# Patient Record
Sex: Female | Born: 1955 | Race: Black or African American | Hispanic: No | Marital: Single | Smoking: Former smoker
Health system: Southern US, Community
[De-identification: ages and names within clinical notes are randomized; demographics above are authoritative.]

## PROBLEM LIST (undated history)

## (undated) DIAGNOSIS — J45909 Unspecified asthma, uncomplicated: Secondary | ICD-10-CM

## (undated) DIAGNOSIS — E1165 Type 2 diabetes mellitus with hyperglycemia: Secondary | ICD-10-CM

## (undated) DIAGNOSIS — I10 Essential (primary) hypertension: Secondary | ICD-10-CM

## (undated) DIAGNOSIS — F329 Major depressive disorder, single episode, unspecified: Secondary | ICD-10-CM

---

## 1996-08-06 HISTORY — PX: ABDOMINAL HYSTERECTOMY: SHX81

## 2005-09-10 ENCOUNTER — Other Ambulatory Visit: Payer: Self-pay

## 2005-09-10 ENCOUNTER — Emergency Department: Payer: Self-pay | Admitting: Emergency Medicine

## 2005-09-16 ENCOUNTER — Other Ambulatory Visit: Payer: Self-pay

## 2005-09-16 ENCOUNTER — Emergency Department: Payer: Self-pay | Admitting: General Practice

## 2005-09-19 ENCOUNTER — Emergency Department: Payer: Self-pay | Admitting: Emergency Medicine

## 2007-10-29 ENCOUNTER — Ambulatory Visit: Payer: Self-pay | Admitting: Family Medicine

## 2009-09-17 ENCOUNTER — Inpatient Hospital Stay: Payer: Self-pay | Admitting: Internal Medicine

## 2010-06-22 ENCOUNTER — Emergency Department: Payer: Self-pay | Admitting: Emergency Medicine

## 2010-11-11 ENCOUNTER — Emergency Department: Payer: Self-pay | Admitting: Emergency Medicine

## 2011-04-07 ENCOUNTER — Ambulatory Visit: Payer: Self-pay | Admitting: Family Medicine

## 2011-12-25 DIAGNOSIS — E785 Hyperlipidemia, unspecified: Secondary | ICD-10-CM | POA: Insufficient documentation

## 2012-03-12 ENCOUNTER — Observation Stay: Payer: Self-pay | Admitting: Internal Medicine

## 2012-03-12 LAB — CBC
MCH: 29.7 pg (ref 26.0–34.0)
MCHC: 32.8 g/dL (ref 32.0–36.0)
MCV: 91 fL (ref 80–100)
Platelet: 326 10*3/uL (ref 150–440)
RDW: 14.4 % (ref 11.5–14.5)
WBC: 8.9 10*3/uL (ref 3.6–11.0)

## 2012-03-12 LAB — COMPREHENSIVE METABOLIC PANEL
Albumin: 3.1 g/dL — ABNORMAL LOW (ref 3.4–5.0)
Alkaline Phosphatase: 72 U/L (ref 50–136)
Anion Gap: 11 (ref 7–16)
BUN: 12 mg/dL (ref 7–18)
Bilirubin,Total: 0.3 mg/dL (ref 0.2–1.0)
Chloride: 106 mmol/L (ref 98–107)
Creatinine: 1.02 mg/dL (ref 0.60–1.30)
EGFR (African American): >60
EGFR (Non-African Amer.): >60
Osmolality: 284 (ref 275–301)
SGPT (ALT): 15 U/L (ref 12–78)
Sodium: 141 mmol/L (ref 136–145)
Total Protein: 7.3 g/dL (ref 6.4–8.2)

## 2012-03-12 LAB — CK TOTAL AND CKMB (NOT AT ARMC)
CK, Total: 121 U/L (ref 21–215)
CK-MB: 1.1 ng/mL (ref 0.5–3.6)

## 2012-03-13 LAB — LIPID PANEL
Cholesterol: 172 mg/dL (ref 0–200)
HDL Cholesterol: 36 mg/dL — ABNORMAL LOW (ref 40–60)
Triglycerides: 227 mg/dL — ABNORMAL HIGH (ref 0–200)

## 2012-03-13 LAB — HEMOGLOBIN A1C: Hemoglobin A1C: 6.9 % — ABNORMAL HIGH (ref 4.2–6.3)

## 2013-05-16 ENCOUNTER — Emergency Department: Payer: Self-pay | Admitting: Emergency Medicine

## 2013-05-16 LAB — CBC
HCT: 37.5 % (ref 35.0–47.0)
HGB: 12.3 g/dL (ref 12.0–16.0)
MCH: 29.5 pg (ref 26.0–34.0)
MCHC: 32.8 g/dL (ref 32.0–36.0)
RDW: 13.7 % (ref 11.5–14.5)

## 2013-05-16 LAB — URINALYSIS, COMPLETE
Bilirubin,UR: NEGATIVE
Blood: NEGATIVE
Glucose,UR: >=500 mg/dL (ref 0–75)
Ph: 5 (ref 4.5–8.0)
RBC,UR: 2 /HPF (ref 0–5)
Squamous Epithelial: <1
WBC UR: 47 /HPF (ref 0–5)

## 2013-05-16 LAB — COMPREHENSIVE METABOLIC PANEL WITH GFR
Albumin: 3.1 g/dL — ABNORMAL LOW
Alkaline Phosphatase: 68 U/L
Anion Gap: 7
BUN: 8 mg/dL
Bilirubin,Total: 0.4 mg/dL
Calcium, Total: 8.6 mg/dL
Chloride: 102 mmol/L
Co2: 27 mmol/L
Creatinine: 1.05 mg/dL
EGFR (African American): >60
EGFR (Non-African Amer.): 58 — ABNORMAL LOW
Glucose: 365 mg/dL — ABNORMAL HIGH
Osmolality: 285
Potassium: 3.7 mmol/L
SGOT(AST): 21 U/L
SGPT (ALT): 19 U/L
Sodium: 136 mmol/L
Total Protein: 6.9 g/dL

## 2013-05-16 LAB — TROPONIN I: Troponin-I: <0.02 ng/mL

## 2013-05-16 LAB — CK TOTAL AND CKMB (NOT AT ARMC)
CK, Total: 65 U/L
CK-MB: 0.6 ng/mL

## 2013-08-06 DIAGNOSIS — E1165 Type 2 diabetes mellitus with hyperglycemia: Secondary | ICD-10-CM

## 2013-08-06 DIAGNOSIS — IMO0002 Reserved for concepts with insufficient information to code with codable children: Secondary | ICD-10-CM

## 2013-08-06 DIAGNOSIS — F32A Depression, unspecified: Secondary | ICD-10-CM

## 2013-08-06 HISTORY — DX: Reserved for concepts with insufficient information to code with codable children: IMO0002

## 2013-08-06 HISTORY — DX: Depression, unspecified: F32.A

## 2013-08-06 HISTORY — DX: Type 2 diabetes mellitus with hyperglycemia: E11.65

## 2013-09-18 ENCOUNTER — Ambulatory Visit: Payer: Self-pay | Admitting: Family Medicine

## 2013-09-18 LAB — GC/CHLAMYDIA PROBE AMP

## 2013-09-18 LAB — WET PREP, GENITAL

## 2013-10-08 DIAGNOSIS — I10 Essential (primary) hypertension: Secondary | ICD-10-CM | POA: Insufficient documentation

## 2013-11-23 DIAGNOSIS — F32A Depression, unspecified: Secondary | ICD-10-CM | POA: Insufficient documentation

## 2013-12-21 ENCOUNTER — Ambulatory Visit: Payer: Self-pay

## 2013-12-21 LAB — RAPID STREP-A WITH REFLX: Micro Text Report: POSITIVE

## 2014-01-08 DIAGNOSIS — J4520 Mild intermittent asthma, uncomplicated: Secondary | ICD-10-CM | POA: Insufficient documentation

## 2014-05-17 ENCOUNTER — Ambulatory Visit: Payer: Self-pay

## 2014-11-23 NOTE — Discharge Summary (Signed)
PATIENT NAME:  Michelle [name], Michelle [name] MR#:  [id number] DATE OF BIRTH:  Sep 20, 1955  DATE OF ADMISSION:  03/12/2012 DATE OF DISCHARGE:  03/15/2012  PRESENTING COMPLAINT: Chest pain.   DISCHARGE DIAGNOSES:  1. Chest pain, suspected noncardiac.  2. Cardiomyopathy with ejection fraction of around 45% per Myoview stress test.  3. Hypertension.  4. Type 2 diabetes.  5. Hyperlipidemia.  6. Medical noncompliance.   CONDITION ON DISCHARGE: Fair.   CODE STATUS: FULL CODE.   DISCHARGE MEDICATIONS:  1. Metoprolol 25 mg [name].o. b.i.d.  2. Lisinopril 5 mg [name].o. daily.  3. Aspirin 81 mg daily.  4. Pravastatin 20 mg daily.  5. Metformin 500 mg [name].o. daily.  6. Albuterol oral inhaler 2 puffs q.i.d. [name].r.n.   DIET: Low sodium, ADA 1800-calorie diet.   FOLLOWUP: [facility] and follow up there.   PROCEDURES: Cardiac stress test showed ejection fraction of around 45%. No acute ischemia noted. Echo Doppler showed grossly normal size. No thrombus. Left ventricular systolic function is normal, ejection fraction around 55%. Cholesterol 172, triglycerides 227, HDL 36, hemoglobin A1c 6.9. Cardiac enzymes, troponin times three negative. Chest x-ray: No acute cardiopulmonary abnormality. EKG showed normal sinus rhythm with minimal voltage criteria for left ventricular hypertrophy. CBC within normal limits. Comprehensive metabolic panel within normal limits except glucose of 146.   BRIEF SUMMARY OF HOSPITAL COURSE: [name] is a 59 year old obese African American female with history of hypertension, asthma, and borderline diabetes who has been off medicines for the past several months secondary to noncompliance, continues with history of smoking, and came to the Emergency Room with:  1. Chest pain: The patient was admitted on the telemetry floor.  She has risk factors of diabetes and hypertension, which was untreated. She was admitted and was ruled out for acute coronary  syndrome by three sets of negative cardiac enzymes negative, no new acute EKG changes, and negative Myoview stress test. The patient's ejection fraction per Myoview by Dr. Darrold JunkerParaschos showed ejection fraction around 45%; however, echo showed ejection fraction over 55%.  It was stressed to the patient to continue her blood pressure medicines in order to avoid any problems with her heart. She was continued on aspirin, beta blockers, ACE inhibitors, and statins. The [facility].  2. Accelerated hypertension: ACE inhibitors did stabilize the patient's blood pressure.  3. Asthma: Stable. No wheezing. Sats normal. We will continue low dose beta blockers for now. Type 2 diabetes: Her metformin was resumed.  4. Tobacco abuse: The patient was counseled on smoking cessation.  5. Hospital stay otherwise remained stable.   TIME SPENT: 40 minutes.    ____________________________ Wylie HailSona A. Allena KatzPatel, MD sap:bjt D: 03/16/2012 07:03:32 ET T: 03/17/2012 11:20:24 ET JOB#: [id number]  cc: Waylon Hershey A. Allena KatzPatel, MD, <Dictator> [facility] Willow OraSONA A Karrie Fluellen MD ELECTRONICALLY SIGNED 03/17/2012 16:06

## 2014-11-23 NOTE — H&P (Signed)
PATIENT NAME:  Michelle [name], Michelle [name] MR#:  [id number] DATE OF BIRTH:  March 31, 1956  DATE OF ADMISSION:  03/12/2012  PRIMARY CARE PHYSICIAN: None local.  REFERRING PHYSICIAN: Olivia MackieGina Martin, MD  HISTORY OF PRESENT ILLNESS: The patient is a 59 year old African American female with a history of hypertension, borderline diabetes, and asthma who presented to the emergency department with chest pain on the left side for two days which is radiating to the left arm and neck, intermittent, 10 out of 10 on maximum. The patient also complains of a headache, dizziness, and palpitations. She was treated with aspirin 325 mg at home and was started on nitroglycerin in the ED. The patient still has chest pain, but she feels better. The patient has hypertension and borderline diabetes. She was on metformin and HCTZ. She has been off these medications since June because she could not afford it.   PAST MEDICAL HISTORY:  1. Hypertension. 2. Diabetes. 3. Asthma.   SOCIAL HISTORY: Has smoked half pack a day for 80 years. No alcohol drinking. No illicit drugs.   PAST SURGICAL HISTORY: Hysterectomy.   FAMILY HISTORY: Mother had a heart attack, stroke, diabetes, and hypertension. Two brothers had heart attacks in their 240s.   MEDICATIONS: None.   ALLERGIES: Penicillin.  REVIEW OF SYSTEMS: CONSTITUTIONAL: The patient denies any fever or chills, but has a headache and dizziness. EYES: No double vision or double vision. ENT: No hearing loss, epistaxis, postnasal drip, or slurred speech. RESPIRATORY: No cough, sputum, shortness of breath, or hematemesis. CARDIOVASCULAR: Positive for chest pain and palpitations. No leg edema, orthopnea, or nocturnal dyspnea. GASTROINTESTINAL: No abdominal pain, nausea, vomiting, or diarrhea. No melena or bloody stools. RESPIRATORY: No cough, sputum, shortness of breath, hemoptysis, or wheezing. GENITOURINARY: No dysuria, hematuria, or incontinence. ENDOCRINE: No polyuria, polydipsia, or heat or  cold intolerance. HEMATOLOGY: No easy bruising or bleeding. NEUROLOGY: No syncope, loss of consciousness, or seizure. SKIN: No rash or jaundice.   PHYSICAL EXAMINATION:   VITALS: Temperature 97.1, blood pressure 165/80, pulse 64, and oxygen saturation 100% on room air.   GENERAL: The patient is alert, awake, and oriented, in no acute distress.   HEENT: Pupils are round, equal, and reactive to light and accommodation. Moist oral mucosa. Clear oropharynx.   NECK: Supple. No JVD or carotid bruit. No lymphadenopathy. No thyromegaly.   CARDIOVASCULAR: S1 and S2 regular rate and rhythm. No murmurs or gallops.   PULMONARY: Bilateral air entry. No wheezing or rales. No use of accessory muscles to breathe.   CHEST WALL: Tenderness on the left side and tenderness on the left shoulder.   ABDOMEN: Soft, obese. No distention or tenderness. No organomegaly. Bowel sounds present.   EXTREMITIES: No edema, clubbing, or cyanosis. No calf tenderness. Strong bilateral pedal pulses.   SKIN: No rash or jaundice.   NEUROLOGY: Alert and oriented x3. No focal deficits. Power 5 out of 5. Sensation intact. Deep tendon reflexes 2+.   LABORATORY, DIAGNOSTIC AND RADIOLOGIC DATA: Troponin less than 0.02. CK 121 and CK-MB 1.1. CBC is normal. Glucose is 146, BUN 12, and creatinine 1.02. Electrolytes are normal.   Chest x-ray: No evidence of acute cardiopulmonary disease.  EKG shows sinus rhythm at 72 beats per minute.   IMPRESSION:  1. Chest pain, possible musculoskeletal etiology due to chest wall tenderness, but need to rule out ACS due to family history of myocardial infarction.  2. Accelerated hypertension.  3. Borderline diabetes.  4. Obesity.  5. History of asthma.  6. Tobacco use.  PLAN OF TREATMENT:  1. The patient will be admitted to the telemetry floor. We will continue aspirin, nitroglycerin [name].r.n., and stop nitro drip and change to Lopressor and lisinopril. We will start Zocor and check troponin  level and lipid panel and the patient needs a stress test as an outpatient. 2. For borderline diabetes, we will start sliding scale and check a Hemoglobin A1c. 3. For tobacco use, the patient was counseled on smoking cessation. 4. GI and DVT prophylaxis.   I discussed the patient's situation and the plan of treatment with the patient.   TIME SPENT: About 50 minutes.  ____________________________ Shaune Pollack, MD qc:slb D: 03/12/2012 16:35:24 ET     T: 03/12/2012 17:02:54 ET        JOB#: [id number] cc: Shaune Pollack, MD, <Dictator> Shaune Pollack MD ELECTRONICALLY SIGNED 03/12/2012 18:32

## 2014-12-08 ENCOUNTER — Ambulatory Visit
Admission: EM | Admit: 2014-12-08 | Discharge: 2014-12-08 | Disposition: A | Discharge status: Home / Self Care | Payer: Medicaid Other | Attending: Emergency Medicine | Admitting: Emergency Medicine

## 2014-12-08 ENCOUNTER — Ambulatory Visit: Payer: Medicaid Other

## 2014-12-08 ENCOUNTER — Encounter: Payer: Self-pay | Admitting: Family Medicine

## 2014-12-08 DIAGNOSIS — I10 Essential (primary) hypertension: Secondary | ICD-10-CM | POA: Insufficient documentation

## 2014-12-08 DIAGNOSIS — E1165 Type 2 diabetes mellitus with hyperglycemia: Secondary | ICD-10-CM | POA: Diagnosis not present

## 2014-12-08 DIAGNOSIS — J029 Acute pharyngitis, unspecified: Secondary | ICD-10-CM | POA: Diagnosis present

## 2014-12-08 DIAGNOSIS — J45909 Unspecified asthma, uncomplicated: Secondary | ICD-10-CM | POA: Diagnosis not present

## 2014-12-08 DIAGNOSIS — J4520 Mild intermittent asthma, uncomplicated: Secondary | ICD-10-CM

## 2014-12-08 DIAGNOSIS — Z87891 Personal history of nicotine dependence: Secondary | ICD-10-CM | POA: Diagnosis not present

## 2014-12-08 DIAGNOSIS — F329 Major depressive disorder, single episode, unspecified: Secondary | ICD-10-CM | POA: Insufficient documentation

## 2014-12-08 DIAGNOSIS — R05 Cough: Secondary | ICD-10-CM | POA: Insufficient documentation

## 2014-12-08 DIAGNOSIS — J209 Acute bronchitis, unspecified: Secondary | ICD-10-CM | POA: Diagnosis not present

## 2014-12-08 DIAGNOSIS — N898 Other specified noninflammatory disorders of vagina: Secondary | ICD-10-CM

## 2014-12-08 DIAGNOSIS — R059 Cough, unspecified: Secondary | ICD-10-CM

## 2014-12-08 DIAGNOSIS — R509 Fever, unspecified: Secondary | ICD-10-CM | POA: Diagnosis present

## 2014-12-08 HISTORY — DX: Essential (primary) hypertension: I10

## 2014-12-08 HISTORY — DX: Major depressive disorder, single episode, unspecified: F32.9

## 2014-12-08 HISTORY — DX: Type 2 diabetes mellitus with hyperglycemia: E11.65

## 2014-12-08 LAB — URINALYSIS COMPLETE WITH MICROSCOPIC (ARMC ONLY)
Bacteria, UA: NONE SEEN — AB
Bilirubin Urine: NEGATIVE
Glucose, UA: 500 mg/dL — AB
Hgb urine dipstick: NEGATIVE
Ketones, ur: NEGATIVE mg/dL
Leukocytes, UA: NEGATIVE
Nitrite: NEGATIVE
Protein, ur: NEGATIVE mg/dL
RBC / HPF: NONE SEEN RBC/hpf (ref <3)
Specific Gravity, Urine: 1.005 (ref 1.005–1.030)
pH: 5 (ref 5.0–8.0)

## 2014-12-08 MED ORDER — IPRATROPIUM-ALBUTEROL 0.5-2.5 (3) MG/3ML IN SOLN
3.0000 mL | RESPIRATORY_TRACT | Status: DC
  Administered 2014-12-08: 3 mL via RESPIRATORY_TRACT

## 2014-12-08 MED ORDER — ALBUTEROL SULFATE HFA 108 (90 BASE) MCG/ACT IN AERS: 2.0000 | INHALATION_SPRAY | RESPIRATORY_TRACT | Status: AC | PRN

## 2014-12-08 MED ORDER — AZITHROMYCIN 250 MG PO TABS: 250.0000 mg | ORAL_TABLET | Freq: Every day | ORAL | Status: DC

## 2014-12-08 MED ORDER — BENZONATATE 100 MG PO CAPS: 100.0000 mg | ORAL_CAPSULE | Freq: Three times a day (TID) | ORAL | Status: DC

## 2014-12-08 NOTE — ED Provider Notes (Addendum)
CSN: [id number]     Arrival date & time 12/08/14  1305 History   None    Chief Complaint  Patient presents with  . Fever    yesterday  . Sore Throat    3 days  . Vaginal Itching    2 days   (Consider location/radiation/quality/duration/timing/severity/associated sxs/prior Treatment) HPI Comments: Pt states she had asthma flare of weekend w coughing and made sore throat. Also staets tried new detergent and toilet paper and having vaginal itching without discharge since then. Pt has since switched back to original detergent/toilet paper. Pt denies recent new sexual partner/activity and denies any concern of STI exposure  Patient is a 59 y.o. female presenting with URI. The history is provided by the patient. No language interpreter was used.  URI Presenting symptoms: congestion, cough, fever and sore throat   Presenting symptoms: no ear pain and no rhinorrhea   Severity:  Moderate Onset quality:  Sudden Timing:  Constant Progression:  Unchanged Chronicity:  New Relieved by:  Nothing Ineffective treatments:  OTC medications, rest and inhaler Associated symptoms: sneezing   Associated symptoms: no headaches and no myalgias   Risk factors: diabetes mellitus and sick contacts     Past Medical History  Diagnosis Date  . Diabetes type 2, uncontrolled 2015  . Hypertension   . Depression 2015   Past Surgical History  Procedure Laterality Date  . Abdominal hysterectomy  1998   Family History  Problem Relation Age of Onset  . Hypertension Mother   . Heart disease Mother   . Kidney disease Father   . Heart disease Father   . Hypertension Father    History  Substance Use Topics  . Smoking status: Former Smoker    Quit date: 08/19/2013  . Smokeless tobacco: Not on file  . Alcohol Use: No   OB History    Gravida Para Term Preterm AB TAB SAB Ectopic Multiple Living   5 4             Review of Systems  Constitutional: Positive for fever. Negative for chills.  HENT:  Positive for congestion, sneezing and sore throat. Negative for ear pain and rhinorrhea.   Eyes: Negative.   Respiratory: Positive for cough.   Gastrointestinal: Negative for nausea and vomiting.  Endocrine: Negative.   Genitourinary: Negative for dysuria.  Musculoskeletal: Negative for myalgias.  Skin: Negative for rash.  Allergic/Immunologic: Negative.   Neurological: Negative for headaches.  Hematological: Negative.   Psychiatric/Behavioral: Negative.   All other systems reviewed and are negative.   Allergies  Morphine and related and Penicillins  Home Medications   Prior to Admission medications   Medication Sig Start Date End Date Taking? Authorizing Provider  citalopram (CELEXA) 10 MG tablet Take 10 mg by mouth daily.   Yes Historical Provider, MD  insulin glargine (LANTUS) 100 UNIT/ML injection Inject 75 Units into the skin at bedtime.   Yes Historical Provider, MD  lisinopril (PRINIVIL,ZESTRIL) 10 MG tablet Take 10 mg by mouth daily.   Yes Historical Provider, MD  metFORMIN (GLUCOPHAGE) 1000 MG tablet Take 1,000 mg by mouth daily with breakfast.   Yes Historical Provider, MD  albuterol (PROVENTIL HFA;VENTOLIN HFA) 108 (90 BASE) MCG/ACT inhaler Inhale 2 puffs into the lungs every 4 (four) hours as needed for wheezing or shortness of breath. 12/08/14   Clancy Gourd, NP  azithromycin (ZITHROMAX) 250 MG tablet Take 1 tablet (250 mg total) by mouth daily. Take first 2 tablets together, then 1 every day until  finished. 12/08/14   Clancy GourdJeanette Lotus Gover, NP  benzonatate (TESSALON) 100 MG capsule Take 1 capsule (100 mg total) by mouth every 8 (eight) hours. 12/08/14   Ilean Spradlin, NP   BP 153/73 mmHg  Pulse 92  Temp(Src) 98.2 F (36.8 C) (Oral)  Resp 18  Ht 5\' 4"  (1.626 m)  Wt 248 lb (112.492 kg)  BMI 42.55 kg/m2  SpO2 99% Physical Exam  Constitutional: She is oriented to person, place, and time. She appears well-developed and well-nourished. She is active and cooperative. No  distress.  HENT:  Head: Normocephalic.  Right Ear: Tympanic membrane normal.  Left Ear: Tympanic membrane normal.  Nose: Mucosal edema present.  Mouth/Throat: Uvula is midline, oropharynx is clear and moist and mucous membranes are normal.  Eyes: Conjunctivae, EOM and lids are normal. Pupils are equal, round, and reactive to light.  Neck: Trachea normal and normal range of motion. No tracheal deviation present.  Cardiovascular: Regular rhythm, normal heart sounds and normal pulses.   No murmur heard. Pulmonary/Chest: Effort normal. She has wheezes in the right lower field and the left lower field.  Mild wheezing noted bialterally  Abdominal: Soft. Bowel sounds are normal. There is no tenderness.  Musculoskeletal: Normal range of motion.  Lymphadenopathy:    She has no cervical adenopathy.  Neurological: She is alert and oriented to person, place, and time. GCS eye subscore is 4. GCS verbal subscore is 5. GCS motor subscore is 6.  Skin: Skin is warm and dry. No rash noted.  Psychiatric: She has a normal mood and affect. Her speech is normal and behavior is normal.  Nursing note and vitals reviewed.   ED Course  Procedures (including critical care time) Labs Review Labs Reviewed  URINALYSIS COMPLETEWITH MICROSCOPIC [place]([facility])  - Abnormal; Notable for the following:    Color, Urine STRAW (*)    Glucose, UA 500 (*)    Bacteria, UA NONE SEEN (*)    Squamous Epithelial / LPF 0-5 (*)    All other components within normal limits    Imaging Review Dg Chest 2 View  12/08/2014   CLINICAL DATA:  Cough and shortness of breath for 4 days. Asthma. Diabetes and hypertension.  EXAM: CHEST  2 VIEW  COMPARISON:  03/12/2012 and 04/07/2011  FINDINGS: Mild linear opacity is seen in both perihilar regions. This unchanged and consistent with mild scarring. No evidence of acute infiltrate or pulmonary edema. No evidence of pleural effusion. Heart size is normal. No evidence of mediastinal widening or  tracheal deviation. No mass or lymphadenopathy identified.  IMPRESSION: Mild bilateral pulmonary scarring.  No active lung disease.   Electronically Signed   By: Myles RosenthalJohn  Stahl M.D.   On: 12/08/2014 15:00     MDM   1. Asthma, mild intermittent, uncomplicated   2. Cough   3. Acute bronchitis, unspecified organism   4. Itching in the vaginal area    Offered pt pelvic exam, pt refused states she will f/u with her PCP for pelvic exam/further treatment,did agree to have UA checked in office to r/o UTI.   Discussed xray results and UA results with pt. Pt verbalized understanding to this provider. Will refill inhaler, prescribe Z pak for bronchitis and have pt f/u with PCP in 2 days for recheck, sooner if worse.Go to ER immediately if develops SOB,DOE, CP, or worsening s/s.    01/14/2015: addendum to 12/08/14 note: Duoneb given in office at 1515 for wheezing, pt reassessed after duoneb, improved. JWD, FNP-BC  Clancy GourdJeanette Symeon Puleo, NP  12/08/14 1613  Clancy GourdJeanette Quincy Prisco, NP 01/14/15 [id number]

## 2014-12-08 NOTE — ED Notes (Signed)
Patient states she has had a asthma attack a couple times in the past week, 3 days ago she started having a sore throat. She had a fever yesterday of 102 in the morning. She took liquid Tylenol to help bring the fever down. On Monday she started having some vaginal itching. She has a white creamy discharge, no urinary symptoms. She states she has never been diagnosed with BV in the past, she has tried a new laundry detergent and a new toilet paper in the last 4 days and then started having the symptoms.

## 2014-12-08 NOTE — Discharge Instructions (Signed)
Asthma °Asthma is a recurring condition in which the airways tighten and narrow. Asthma can make it difficult to breathe. It can cause coughing, wheezing, and shortness of breath. Asthma episodes, also called asthma attacks, range from minor to life-threatening. Asthma cannot be cured, but medicines and lifestyle changes can help control it. °CAUSES °Asthma is believed to be caused by inherited (genetic) and environmental factors, but its exact cause is unknown. Asthma may be triggered by allergens, lung infections, or irritants in the air. Asthma triggers are different for each person. Common triggers include:  °· Animal dander. °· Dust mites. °· Cockroaches. °· Pollen from trees or grass. °· Mold. °· Smoke. °· Air pollutants such as dust, household cleaners, hair sprays, aerosol sprays, paint fumes, strong chemicals, or strong odors. °· Cold air, weather changes, and winds (which increase molds and pollens in the air). °· Strong emotional expressions such as crying or laughing hard. °· Stress. °· Certain medicines (such as aspirin) or types of drugs (such as beta-blockers). °· Sulfites in foods and drinks. Foods and drinks that may contain sulfites include dried fruit, potato chips, and sparkling grape juice. °· Infections or inflammatory conditions such as the flu, a cold, or an inflammation of the nasal membranes (rhinitis). °· Gastroesophageal reflux disease (GERD). °· Exercise or strenuous activity. °SYMPTOMS °Symptoms may occur immediately after asthma is triggered or many hours later. Symptoms include: °· Wheezing. °· Excessive nighttime or early morning coughing. °· Frequent or severe coughing with a common cold. °· Chest tightness. °· Shortness of breath. °DIAGNOSIS  °The diagnosis of asthma is made by a review of your medical history and a physical exam. Tests may also be performed. These may include: °· Lung function studies. These tests show how much air you breathe in and out. °· Allergy  tests. °· Imaging tests such as X-rays. °TREATMENT  °Asthma cannot be cured, but it can usually be controlled. Treatment involves identifying and avoiding your asthma triggers. It also involves medicines. There are 2 classes of medicine used for asthma treatment:  °· Controller medicines. These prevent asthma symptoms from occurring. They are usually taken every day. °· Reliever or rescue medicines. These quickly relieve asthma symptoms. They are used as needed and provide short-term relief. °Your health care provider will help you create an asthma action plan. An asthma action plan is a written plan for managing and treating your asthma attacks. It includes a list of your asthma triggers and how they may be avoided. It also includes information on when medicines should be taken and when their dosage should be changed. An action plan may also involve the use of a device called a peak flow meter. A peak flow meter measures how well the lungs are working. It helps you monitor your condition. °HOME CARE INSTRUCTIONS  °· Take medicines only as directed by your health care provider. Speak with your health care provider if you have questions about how or when to take the medicines. °· Use a peak flow meter as directed by your health care provider. Record and keep track of readings. °· Understand and use the action plan to help minimize or stop an asthma attack without needing to seek medical care. °· Control your home environment in the following ways to help prevent asthma attacks: °¨ Do not smoke. Avoid being exposed to secondhand smoke. °¨ Change your heating and air conditioning filter regularly. °¨ Limit your use of fireplaces and wood stoves. °¨ Get rid of pests (such as roaches and   mice) and their droppings.  Throw away plants if you see mold on them.  Clean your floors and dust regularly. Use unscented cleaning products.  Try to have someone else vacuum for you regularly. Stay out of rooms while they are  being vacuumed and for a short while afterward. If you vacuum, use a dust mask from a hardware store, a double-layered or microfilter vacuum cleaner bag, or a vacuum cleaner with a HEPA filter.  Replace carpet with wood, tile, or vinyl flooring. Carpet can trap dander and dust.  Use allergy-proof pillows, mattress covers, and box spring covers.  Wash bed sheets and blankets every week in hot water and dry them in a dryer.  Use blankets that are made of polyester or cotton.  Clean bathrooms and kitchens with bleach. If possible, have someone repaint the walls in these rooms with mold-resistant paint. Keep out of the rooms that are being cleaned and painted.  Wash hands frequently. SEEK MEDICAL CARE IF:   You have wheezing, shortness of breath, or a cough even if taking medicine to prevent attacks.  The colored mucus you cough up (sputum) is thicker than usual.  Your sputum changes from clear or white to yellow, green, gray, or bloody.  You have any problems that may be related to the medicines you are taking (such as a rash, itching, swelling, or trouble breathing).  You are using a reliever medicine more than 2-3 times per week.  Your peak flow is still at 50-79% of your personal best after following your action plan for 1 hour.  You have a fever. SEEK IMMEDIATE MEDICAL CARE IF:   You seem to be getting worse and are unresponsive to treatment during an asthma attack.  You are short of breath even at rest.  You get short of breath when doing very little physical activity.  You have difficulty eating, drinking, or talking due to asthma symptoms.  You develop chest pain.  You develop a fast heartbeat.  You have a bluish color to your lips or fingernails.  You are light-headed, dizzy, or faint.  Your peak flow is less than 50% of your personal best. MAKE SURE YOU:   Understand these instructions.  Will watch your condition.  Will get help right away if you are not  doing well or get worse. Document Released: 07/23/2005 Document Revised: 12/07/2013 Document Reviewed: 02/19/2013 [facility] Patient Information 2015 [place], [place]. This information is not intended to replace advice given to you by your health care provider. Make sure you discuss any questions you have with your health care provider.  Acute Bronchitis Bronchitis is inflammation of the airways that extend from the windpipe into the lungs (bronchi). The inflammation often causes mucus to develop. This leads to a cough, which is the most common symptom of bronchitis.  In acute bronchitis, the condition usually develops suddenly and goes away over time, usually in a couple weeks. Smoking, allergies, and asthma can make bronchitis worse. Repeated episodes of bronchitis may cause further lung problems.  CAUSES Acute bronchitis is most often caused by the same virus that causes a cold. The virus can spread from person to person (contagious) through coughing, sneezing, and touching contaminated objects. SIGNS AND SYMPTOMS   Cough.   Fever.   Coughing up mucus.   Body aches.   Chest congestion.   Chills.   Shortness of breath.   Sore throat.  DIAGNOSIS  Acute bronchitis is usually diagnosed through a physical exam. Your health care provider will  also ask you questions about your medical history. Tests, such as chest X-rays, are sometimes done to rule out other conditions.  TREATMENT  Acute bronchitis usually goes away in a couple weeks. Oftentimes, no medical treatment is necessary. Medicines are sometimes given for relief of fever or cough. Antibiotic medicines are usually not needed but may be prescribed in certain situations. In some cases, an inhaler may be recommended to help reduce shortness of breath and control the cough. A cool mist vaporizer may also be used to help thin bronchial secretions and make it easier to clear the chest.  HOME CARE INSTRUCTIONS  Get plenty of rest.    Drink enough fluids to keep your urine clear or pale yellow (unless you have a medical condition that requires fluid restriction). Increasing fluids may help thin your respiratory secretions (sputum) and reduce chest congestion, and it will prevent dehydration.   Take medicines only as directed by your health care provider.  If you were prescribed an antibiotic medicine, finish it all even if you start to feel better.  Avoid smoking and secondhand smoke. Exposure to cigarette smoke or irritating chemicals will make bronchitis worse. If you are a smoker, consider using nicotine gum or skin patches to help control withdrawal symptoms. Quitting smoking will help your lungs heal faster.   Reduce the chances of another bout of acute bronchitis by washing your hands frequently, avoiding people with cold symptoms, and trying not to touch your hands to your mouth, nose, or eyes.   Keep all follow-up visits as directed by your health care provider.  SEEK MEDICAL CARE IF: Your symptoms do not improve after 1 week of treatment.  SEEK IMMEDIATE MEDICAL CARE IF:  You develop an increased fever or chills.   You have chest pain.   You have severe shortness of breath.  You have bloody sputum.   You develop dehydration.  You faint or repeatedly feel like you are going to pass out.  You develop repeated vomiting.  You develop a severe headache. MAKE SURE YOU:   Understand these instructions.  Will watch your condition.  Will get help right away if you are not doing well or get worse. Document Released: 08/30/2004 Document Revised: 12/07/2013 Document Reviewed: 01/13/2013 [facility]LPExitCare Patient Information 2015 [place], [place]. This information is not intended to replace advice given to you by your health care provider. Make sure you discuss any questions you have with your health care provider.  Please follow up with your PCP for recheck of general medical issues. You will need to have a  pelvic/vaginal exam completed by your provider, Most likely your vaginal itching is d/t your recent switch of detergents, but unclear without further evaluation/testing. Avoid known irritants. Take meds as directed(Zpak, albuterol). Drink plenty of water. Go to Er if you develop SOB, difficulty breathing or worsening issues.

## 2015-04-24 ENCOUNTER — Emergency Department
Admission: EM | Admit: 2015-04-24 | Discharge: 2015-04-24 | Disposition: A | Discharge status: Home / Self Care | Payer: Medicaid Other | Attending: Emergency Medicine | Admitting: Emergency Medicine

## 2015-04-24 ENCOUNTER — Encounter: Payer: Self-pay | Admitting: Emergency Medicine

## 2015-04-24 DIAGNOSIS — Z792 Long term (current) use of antibiotics: Secondary | ICD-10-CM | POA: Diagnosis not present

## 2015-04-24 DIAGNOSIS — E1165 Type 2 diabetes mellitus with hyperglycemia: Secondary | ICD-10-CM | POA: Diagnosis present

## 2015-04-24 DIAGNOSIS — I10 Essential (primary) hypertension: Secondary | ICD-10-CM | POA: Insufficient documentation

## 2015-04-24 DIAGNOSIS — N390 Urinary tract infection, site not specified: Secondary | ICD-10-CM | POA: Diagnosis not present

## 2015-04-24 DIAGNOSIS — Z880 Allergy status to penicillin: Secondary | ICD-10-CM | POA: Diagnosis not present

## 2015-04-24 DIAGNOSIS — Z79899 Other long term (current) drug therapy: Secondary | ICD-10-CM | POA: Diagnosis not present

## 2015-04-24 DIAGNOSIS — Z794 Long term (current) use of insulin: Secondary | ICD-10-CM | POA: Insufficient documentation

## 2015-04-24 DIAGNOSIS — R531 Weakness: Secondary | ICD-10-CM | POA: Insufficient documentation

## 2015-04-24 DIAGNOSIS — Z87891 Personal history of nicotine dependence: Secondary | ICD-10-CM | POA: Insufficient documentation

## 2015-04-24 DIAGNOSIS — R739 Hyperglycemia, unspecified: Secondary | ICD-10-CM

## 2015-04-24 HISTORY — DX: Unspecified asthma, uncomplicated: J45.909

## 2015-04-24 LAB — BASIC METABOLIC PANEL
ANION GAP: 7 (ref 5–15)
BUN: 8 mg/dL (ref 6–20)
CHLORIDE: 99 mmol/L — AB (ref 101–111)
CO2: 27 mmol/L (ref 22–32)
Calcium: 9.2 mg/dL (ref 8.9–10.3)
Creatinine, Ser: 0.84 mg/dL (ref 0.44–1.00)
GFR calc Af Amer: >60 mL/min (ref >60)
GFR calc non Af Amer: >60 mL/min (ref >60)
GLUCOSE: 418 mg/dL — AB (ref 65–99)
POTASSIUM: 3.8 mmol/L (ref 3.5–5.1)
Sodium: 133 mmol/L — ABNORMAL LOW (ref 135–145)

## 2015-04-24 LAB — URINALYSIS COMPLETE WITH MICROSCOPIC (ARMC ONLY)
Bacteria, UA: NONE SEEN
Bilirubin Urine: NEGATIVE
Glucose, UA: >1000 mg/dL — AB
KETONES UR: NEGATIVE mg/dL
Nitrite: POSITIVE — AB
PH: 5 (ref 5.0–8.0)
PROTEIN: NEGATIVE mg/dL
Specific Gravity, Urine: 1.005 (ref 1.005–1.030)

## 2015-04-24 LAB — CBC
HEMATOCRIT: 41.5 % (ref 35.0–47.0)
HEMOGLOBIN: 13.3 g/dL (ref 12.0–16.0)
MCH: 28.2 pg (ref 26.0–34.0)
MCHC: 32.1 g/dL (ref 32.0–36.0)
MCV: 87.7 fL (ref 80.0–100.0)
Platelets: 303 10*3/uL (ref 150–440)
RBC: 4.73 MIL/uL (ref 3.80–5.20)
RDW: 13.5 % (ref 11.5–14.5)
WBC: 9.3 10*3/uL (ref 3.6–11.0)

## 2015-04-24 LAB — GLUCOSE, CAPILLARY
Glucose-Capillary: 368 mg/dL — ABNORMAL HIGH (ref 65–99)
Glucose-Capillary: 317 mg/dL — ABNORMAL HIGH (ref 65–99)

## 2015-04-24 MED ORDER — ONDANSETRON HCL 4 MG PO TABS: 4.0000 mg | ORAL_TABLET | Freq: Three times a day (TID) | ORAL | Status: AC | PRN

## 2015-04-24 MED ORDER — DIPHENHYDRAMINE HCL 50 MG/ML IJ SOLN
25.0000 mg | Freq: Once | INTRAMUSCULAR | Status: AC
  Administered 2015-04-24: 25 mg via INTRAVENOUS
  Filled 2015-04-24: qty 1

## 2015-04-24 MED ORDER — CIPROFLOXACIN IN D5W 400 MG/200ML IV SOLN
400.0000 mg | Freq: Once | INTRAVENOUS | Status: AC
  Administered 2015-04-24: 400 mg via INTRAVENOUS
  Filled 2015-04-24: qty 200

## 2015-04-24 MED ORDER — ONDANSETRON HCL 4 MG/2ML IJ SOLN
4.0000 mg | Freq: Once | INTRAMUSCULAR | Status: AC
  Administered 2015-04-24: 4 mg via INTRAVENOUS
  Filled 2015-04-24: qty 2

## 2015-04-24 MED ORDER — SODIUM CHLORIDE 0.9 % IV BOLUS (SEPSIS)
1000.0000 mL | Freq: Once | INTRAVENOUS | Status: AC
  Administered 2015-04-24: 1000 mL via INTRAVENOUS

## 2015-04-24 MED ORDER — SULFAMETHOXAZOLE-TRIMETHOPRIM 800-160 MG PO TABS: 1.0000 | ORAL_TABLET | Freq: Two times a day (BID) | ORAL | Status: DC

## 2015-04-24 MED ORDER — CIPROFLOXACIN HCL 500 MG PO TABS
500.0000 mg | ORAL_TABLET | Freq: Two times a day (BID) | ORAL | Status: DC
Start: 2015-04-24 — End: 2015-04-24

## 2015-04-24 MED ORDER — KETOROLAC TROMETHAMINE 30 MG/ML IJ SOLN
30.0000 mg | Freq: Once | INTRAMUSCULAR | Status: AC
  Administered 2015-04-24: 30 mg via INTRAVENOUS
  Filled 2015-04-24: qty 1

## 2015-04-24 MED ORDER — INSULIN ASPART 100 UNIT/ML ~~LOC~~ SOLN
10.0000 [IU] | Freq: Once | SUBCUTANEOUS | Status: AC
  Administered 2015-04-24: 10 [IU] via SUBCUTANEOUS
  Filled 2015-04-24: qty 10

## 2015-04-24 NOTE — ED Notes (Signed)
CBG 368 

## 2015-04-24 NOTE — Discharge Instructions (Signed)
Please drink plenty of fluids stay well-hydrated. You may take a full liquid diet for the next 12-24 hours to help with nausea, then advance to a bland diet as tolerated. Next  Taking entire course of antibiotics, even if you're feeling better.  Please continue to take your blood sugars, and tachycardia her primary care physician if they continue to be elevated.  Please make a follow-up appointment with her primary care physician. Have your doctor follow-up your urine culture results.  Please return to the emergency department for increased pain, fever, inability to keep down fluids, or any other symptoms concerning to you.

## 2015-04-24 NOTE — ED Notes (Signed)
Discharge instructions, follow-up care, and prescriptions reviewed with patient. No questions or concerns at this time. Pt stable at discharge. 

## 2015-04-24 NOTE — ED Notes (Signed)
Pt reporting generalized itching after Cipro infusion completed. No c/o shortness of breath, wheezing, no rash noted. Reported to MD.

## 2015-04-24 NOTE — ED Provider Notes (Addendum)
[facility] Emergency Department Provider Note  ____________________________________________  Time seen: Approximately 12:24 PM  I have reviewed the triage vital signs and the nursing notes.   HISTORY  Chief Complaint Hyperglycemia   HPI Michelle [name] is a 59 y.o. female with a history of HTN, DM, presenting with hematuria. She reports that she was seen at [facility] several days ago with concern for yeast infection, she was treated with Diflucan. Over the past few days she has had some general malaise and nausea without vomiting, mild headache. Today, she developed hematuria and pain with urination, increased urinary frequency. She has noted hyperglycemia. No fevers, chills, nausea or vomiting, diarrhea, abdominal pain, or cough or cold symptoms.   Past Medical History  Diagnosis Date  . Diabetes type 2, uncontrolled 2015  . Hypertension   . Depression 2015  . Asthma     There are no active problems to display for this patient.   Past Surgical History  Procedure Laterality Date  . Abdominal hysterectomy  1998    Current Outpatient Rx  Name  Route  Sig  Dispense  Refill  . albuterol (PROVENTIL HFA;VENTOLIN HFA) 108 (90 BASE) MCG/ACT inhaler   Inhalation   Inhale 2 puffs into the lungs every 4 (four) hours as needed for wheezing or shortness of breath.   1 Inhaler   0   . benzonatate (TESSALON) 100 MG capsule   Oral   Take 1 capsule (100 mg total) by mouth every 8 (eight) hours.   21 capsule   0   . ciprofloxacin (CIPRO) 500 MG tablet   Oral   Take 1 tablet (500 mg total) by mouth 2 (two) times daily.   14 tablet   0   . citalopram (CELEXA) 10 MG tablet   Oral   Take 10 mg by mouth daily.         . insulin glargine (LANTUS) 100 UNIT/ML injection   Subcutaneous   Inject 75 Units into the skin at bedtime.         Marland Kitchen lisinopril (PRINIVIL,ZESTRIL) 10 MG tablet   Oral   Take 10 mg by mouth daily.         . metFORMIN  (GLUCOPHAGE) 1000 MG tablet   Oral   Take 1,000 mg by mouth daily with breakfast.         . ondansetron (ZOFRAN) 4 MG tablet   Oral   Take 1 tablet (4 mg total) by mouth every 8 (eight) hours as needed for nausea or vomiting.   12 tablet   1     Allergies Morphine and related and Penicillins  Family History  Problem Relation Age of Onset  . Hypertension Mother   . Heart disease Mother   . Kidney disease Father   . Heart disease Father   . Hypertension Father     Social History Social History  Substance Use Topics  . Smoking status: Former Smoker    Quit date: 08/19/2013  . Smokeless tobacco: None  . Alcohol Use: No    Review of Systems Constitutional: No fever/chills Eyes: No visual changes. ENT: No sore throat. Cardiovascular: Denies chest pain, palpitations. Respiratory: Denies shortness of breath.  No cough. Gastrointestinal: No abdominal pain.  No nausea, no vomiting.  No diarrhea.  No constipation. No flank pain. Genitourinary: Positive for dysuria, urinary frequency. Positive for hematuria. Musculoskeletal: Negative for back pain. Skin: Negative for rash. Neurological: Mild headaches, generalized but no focal weakness. 10-point ROS otherwise  negative.  ____________________________________________   PHYSICAL EXAM:  VITAL SIGNS: ED Triage Vitals  Enc Vitals Group     BP 04/24/15 1143 182/98 mmHg     Pulse Rate 04/24/15 1143 76     Resp 04/24/15 1143 14     Temp 04/24/15 1143 98 F (36.7 C)     Temp Source 04/24/15 1143 Oral     SpO2 04/24/15 1143 97 %     Weight 04/24/15 1143 229 lb 8 oz (104.1 kg)     Height 04/24/15 1143  (1.626 m)     Head Cir --      Peak Flow --      Pain Score --      Pain Loc --      Pain Edu? --      Excl. in GC? --     Constitutional: Alert and oriented. Well appearing and in no acute distress; lying comfortably in stretcher.. Answers questions appropriately. Eyes: Conjunctivae are normal.  EOMI. Head:  Atraumatic. Nose: No congestion/rhinnorhea. Mouth/Throat: Mucous membranes are moist.  Neck: No stridor.  Supple.   Cardiovascular: Normal rate, regular rhythm. No murmurs, rubs or gallops.  Respiratory: Normal respiratory effort.  No retractions. Lungs CTAB.  No wheezes, rales or ronchi. Gastrointestinal: Soft, nondistended. Obese. Mild tenderness to palpation in the suprapubic area without rebound or guarding. No peritoneal signs.  Musculoskeletal: No LE edema.  Neurologic:  Normal speech and language. No gross focal neurologic deficits are appreciated.  Skin:  Skin is warm, dry and intact. No rash noted. Psychiatric: Mood and affect are normal. Speech and behavior are normal.  Normal judgement.  ____________________________________________   LABS (all labs ordered are listed, but only abnormal results are displayed)  Labs Reviewed  BASIC METABOLIC PANEL - Abnormal; Notable for the following:    Sodium 133 (*)    Chloride 99 (*)    Glucose, Bld 418 (*)    All other components within normal limits  URINALYSIS COMPLETEWITH MICROSCOPIC ([facility] ONLY) - Abnormal; Notable for the following:    Color, Urine STRAW (*)    APPearance CLOUDY (*)    Glucose, UA >1000 (*)    Hgb urine dipstick 3+ (*)    Nitrite POSITIVE (*)    Leukocytes, UA 3+ (*)    Squamous Epithelial / LPF 0-5 (*)    All other components within normal limits  CBC   ____________________________________________  EKG  Not indicated. ____________________________________________  RADIOLOGY  No results found.  ____________________________________________   PROCEDURES  Procedure(s) performed: None  Critical Care performed: No ____________________________________________   INITIAL IMPRESSION / ASSESSMENT AND PLAN / ED COURSE  Pertinent labs & imaging results that were available during my care of the patient were reviewed by me and considered in my medical decision making (see chart for details).  59 y.o.  female with history of DM, HTN, with suprapubic tenderness, hematuria and urinary symptoms. Will rule out UTI and also check labs to rule out DKA given diabetes with hyperglycemia today. Patient overall is well-appearing and nontoxic. It is unlikely that she has acute abdominal surgical pathology.  ----------------------------------------- 12:43 PM on 04/24/2015 -----------------------------------------  The patient's urine does show evidence of urinary tract infection. I will treat her with ciprofloxacin given her penicillin allergy. She will get her first dose here, and I will treat her hyperglycemia with fluid and insulin. She understands the follow-up plan and red flag warnings for return. At the time of discharge, the patient is feeling better and able  to tolerate by mouth.  ----------------------------------------- 2:12 PM on 04/24/2015 -----------------------------------------  Patient is stating that she is feeling itchy in the arms and legs, with no visible rash. We'll give her Benadryl for treatment here, and change her prescription to Bactrim. ____________________________________________  FINAL CLINICAL IMPRESSION(S) / ED DIAGNOSES  Final diagnoses:  Hyperglycemia  UTI (lower urinary tract infection)      NEW MEDICATIONS STARTED DURING THIS VISIT:  New Prescriptions   CIPROFLOXACIN (CIPRO) 500 MG TABLET    Take 1 tablet (500 mg total) by mouth 2 (two) times daily.   ONDANSETRON (ZOFRAN) 4 MG TABLET    Take 1 tablet (4 mg total) by mouth every 8 (eight) hours as needed for nausea or vomiting.     Rockne Menghini, MD 04/24/15 1243  Rockne Menghini, MD 04/24/15 [id number]

## 2015-04-24 NOTE — ED Notes (Signed)
[facility] c/o dizzyness, nausea, and hyperglycemia. Pt states she was seen at [facility] 3 days ago for same symptoms and given 1 day course of antibiotics for UTI. Pt c/o dysuria and urinary frequency.

## 2015-07-19 DIAGNOSIS — F3341 Major depressive disorder, recurrent, in partial remission: Secondary | ICD-10-CM | POA: Insufficient documentation

## 2015-07-23 DIAGNOSIS — F23 Brief psychotic disorder: Secondary | ICD-10-CM | POA: Insufficient documentation

## 2016-05-14 ENCOUNTER — Ambulatory Visit
Admission: EM | Admit: 2016-05-14 | Discharge: 2016-05-14 | Disposition: A | Discharge status: Home / Self Care | Payer: Medicaid Other | Attending: Family Medicine | Admitting: Family Medicine

## 2016-05-14 ENCOUNTER — Encounter: Payer: Self-pay | Admitting: *Deleted

## 2016-05-14 DIAGNOSIS — H109 Unspecified conjunctivitis: Secondary | ICD-10-CM | POA: Diagnosis not present

## 2016-05-14 DIAGNOSIS — B9689 Other specified bacterial agents as the cause of diseases classified elsewhere: Secondary | ICD-10-CM

## 2016-05-14 MED ORDER — ERYTHROMYCIN 5 MG/GM OP OINT: 1.0000 "application " | TOPICAL_OINTMENT | Freq: Four times a day (QID) | OPHTHALMIC | 0 refills | Status: DC

## 2016-05-14 NOTE — ED Triage Notes (Signed)
Both eyes painful, itching and red, onset yesterday. Right worse than left. Pt is a Administrator, sportsdaycare worker.

## 2016-05-14 NOTE — Discharge Instructions (Signed)
Take medication as prescribed. Practice good hand hygiene. ° °Follow up with your primary care physician this week as needed. Return to Urgent care for new or worsening concerns.  ° °

## 2016-05-14 NOTE — ED Provider Notes (Signed)
[facility] ____________________________________________  Time seen: Approximately 9:16 AM  I have reviewed the triage vital signs and the nursing notes.   HISTORY  Chief Complaint Eye Pain and Eye Drainage   HPI Michelle [name] is a 60 y.o. female  presenting for evaluation of bilateral eye itching, red eye and some drainage. Patient reports she woke up with these symptoms yesterday morning. Patient reports right eyes started first and then progressed to the left eye. Denies any vision changes. States eyes feel irritated and burning but denies pain. States some drainage from eyes for the last 2 days that has been thick. Denies any foreign bodies, foreign body sensation, chemical exposure. Denies any runny nose, nasal congestion or cough. Denies recent sickness.  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 Denies vision changes. Does not wear glasses or contacts, except reading glasses. Patient reports feeling well otherwise and denies other complaints.  Tiajuana AmassKIMANI, MAUREEN, MD PCP No LMP recorded. Patient has had a hysterectomy.   Past Medical History:  Diagnosis Date  . Asthma   . Depression 2015  . Diabetes type 2, uncontrolled (HCC) 2015  . Hypertension     There are no active problems to display for this patient.   Past Surgical History:  Procedure Laterality Date  . ABDOMINAL HYSTERECTOMY  1998    Current Outpatient Rx  . Order #: [id number] Class: Normal  . Order #: [id number] Class: Normal  . Order #: [id number] Class: Historical Med  . Order #: [id number] Class: Historical Med  . Order #: [id number] Class: Historical Med  . Order #: [id number] Class: Historical Med  . Order #: [id number] Class: Normal  . Order #: [id number] Class: Print    No current facility-administered medications for this encounter.   Current Outpatient Prescriptions:  .  albuterol (PROVENTIL HFA;VENTOLIN HFA) 108 (90  BASE) MCG/ACT inhaler, Inhale 2 puffs into the lungs every 4 (four) hours as needed for wheezing or shortness of breath., Disp: 1 Inhaler, Rfl: 0 .  benzonatate (TESSALON) 100 MG capsule, Take 1 capsule (100 mg total) by mouth every 8 (eight) hours., Disp: 21 capsule, Rfl: 0 .  citalopram (CELEXA) 10 MG tablet, Take 10 mg by mouth daily., Disp: , Rfl:  .  insulin glargine (LANTUS) 100 UNIT/ML injection, Inject 75 Units into the skin at bedtime., Disp: , Rfl:  .  lisinopril (PRINIVIL,ZESTRIL) 10 MG tablet, Take 10 mg by mouth daily., Disp: , Rfl:  .  metFORMIN (GLUCOPHAGE) 1000 MG tablet, Take 1,000 mg by mouth daily with breakfast., Disp: , Rfl:  .  erythromycin ophthalmic ointment, Place 1 application into both eyes 4 (four) times daily. For seven days, Disp: 3.5 g, Rfl: 0 .  sulfamethoxazole-trimethoprim (BACTRIM DS,SEPTRA DS) 800-160 MG per tablet, Take 1 tablet by mouth 2 (two) times daily., Disp: 14 tablet, Rfl: 0  Allergies Ciprofloxacin; Morphine and related; and Penicillins  Family History  Problem Relation Age of Onset  . Hypertension Mother   . Heart disease Mother   . Kidney disease Father   . Heart disease Father   . Hypertension Father     Social History Social History  Substance Use Topics  . Smoking status: Former Smoker    Quit date: 08/19/2013  . Smokeless tobacco: Never Used  . Alcohol use No    Review of Systems Constitutional: No fever/chills Eyes: No visual changes. As above.  ENT: No sore throat. Cardiovascular: Denies chest pain. Respiratory: Denies shortness of breath. Gastrointestinal: No abdominal  pain.  No nausea, no vomiting.  No diarrhea.  No constipation. Genitourinary: Negative for dysuria. Musculoskeletal: Negative for back pain. Skin: Negative for rash. Neurological: Negative for headaches, focal weakness or numbness.  10-point ROS otherwise negative.  ____________________________________________   PHYSICAL EXAM:  VITAL SIGNS: ED  Triage Vitals  Enc Vitals Group     BP      Pulse      Resp      Temp      Temp src      SpO2      Weight      Height      Head Circumference      Peak Flow      Pain Score      Pain Loc      Pain Edu?      Excl. in GC?    Today's Vitals   05/14/16 0914 05/14/16 0916 05/14/16 0937  BP: (!) 177/96    Pulse: 75    Resp: 16    Temp: 97.3 F (36.3 C)    TempSrc: Oral    SpO2: 98%    Weight:  224 lb (101.6 kg)   Height:  5\' 4"  (1.626 m)   PainSc:   2      Visual Acuity  Right Eye Distance: 20/40 Left Eye Distance: 20/40 Bilateral Distance: 20/30   Constitutional: Alert and oriented. Well appearing and in no acute distress. Eyes: Bilateral conjunctiva with mild injection, slight right eye yellowish exudate, no foreign bodies visualized, bilateral eyes nontender, no surrounding erythema or swelling or tenderness bilaterally. PERRL. EOMI. ENT      Head: Normocephalic and atraumatic.      Nose: No congestion/rhinnorhea.      Mouth/Throat: Mucous membranes are moist.Oropharynx non-erythematous. Cardiovascular: Normal rate, regular rhythm. Grossly normal heart sounds.  Good peripheral circulation. Respiratory: Normal respiratory effort without tachypnea nor retractions. Breath sounds are clear and equal bilaterally. No wheezes/rales/rhonchi.. Gastrointestinal: Soft and nontender. Musculoskeletal:  Ambulatory with steady gait. Neurologic:  Normal speech and language. No gross focal neurologic deficits are appreciated. Speech is normal. No gait instability.  Skin:  Skin is warm, dry and intact. No rash noted. Psychiatric: Mood and affect are normal. Speech and behavior are normal. Patient exhibits appropriate insight and judgment   ___________________________________________   LABS (all labs ordered are listed, but only abnormal results are displayed)  Labs Reviewed - No data to display ____________________________________________   PROCEDURES Procedures    INITIAL  IMPRESSION / ASSESSMENT AND PLAN / ED COURSE  Pertinent labs & imaging results that were available during my care of the patient were reviewed by me and considered in my medical decision making (see chart for details).  Very well-appearing patient. No acute distress. Presents for complaints of bilateral red eye, itching and some drainage. Suspect bacterial conjunctivitis. Discussed supportive care and proper hand hygiene. Will treat patient with erythromycin ophthalmic ointment. 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  Discussed follow up with Primary care physician this week. Discussed follow up and return parameters including no resolution or any worsening concerns. Patient verbalized understanding and agreed to plan.   ____________________________________________   FINAL CLINICAL IMPRESSION(S) / ED DIAGNOSES  Final diagnoses:  Bacterial conjunctivitis of both eyes     Discharge Medication List as of 05/14/2016  9:28 AM    START taking these medications   Details  erythromycin ophthalmic ointment Place 1 application into  both eyes 4 (four) times daily. For seven days, Starting Mon 05/14/2016, Normal        Note: This dictation was prepared with Dragon dictation along with smaller phrase technology. Any transcriptional errors that result from this process are unintentional.    Clinical Course      Renford Dills, NP 05/14/16 1040

## 2016-05-31 ENCOUNTER — Emergency Department: Payer: Medicaid Other

## 2016-05-31 ENCOUNTER — Emergency Department
Admission: EM | Admit: 2016-05-31 | Discharge: 2016-05-31 | Disposition: A | Discharge status: Home / Self Care | Payer: Medicaid Other | Attending: Emergency Medicine | Admitting: Emergency Medicine

## 2016-05-31 DIAGNOSIS — Z87891 Personal history of nicotine dependence: Secondary | ICD-10-CM | POA: Diagnosis not present

## 2016-05-31 DIAGNOSIS — E119 Type 2 diabetes mellitus without complications: Secondary | ICD-10-CM | POA: Diagnosis not present

## 2016-05-31 DIAGNOSIS — Z79899 Other long term (current) drug therapy: Secondary | ICD-10-CM | POA: Diagnosis not present

## 2016-05-31 DIAGNOSIS — N390 Urinary tract infection, site not specified: Secondary | ICD-10-CM | POA: Insufficient documentation

## 2016-05-31 DIAGNOSIS — G459 Transient cerebral ischemic attack, unspecified: Secondary | ICD-10-CM | POA: Diagnosis not present

## 2016-05-31 DIAGNOSIS — I10 Essential (primary) hypertension: Secondary | ICD-10-CM | POA: Diagnosis not present

## 2016-05-31 DIAGNOSIS — J45909 Unspecified asthma, uncomplicated: Secondary | ICD-10-CM | POA: Diagnosis not present

## 2016-05-31 DIAGNOSIS — Z7984 Long term (current) use of oral hypoglycemic drugs: Secondary | ICD-10-CM | POA: Diagnosis not present

## 2016-05-31 LAB — URINALYSIS COMPLETE WITH MICROSCOPIC (ARMC ONLY)
BILIRUBIN URINE: NEGATIVE
Hgb urine dipstick: NEGATIVE
Ketones, ur: NEGATIVE mg/dL
Nitrite: NEGATIVE
Protein, ur: NEGATIVE mg/dL
SPECIFIC GRAVITY, URINE: 1.024 (ref 1.005–1.030)
pH: 6 (ref 5.0–8.0)

## 2016-05-31 LAB — BASIC METABOLIC PANEL
Anion gap: 7 (ref 5–15)
BUN: 10 mg/dL (ref 6–20)
CALCIUM: 8.7 mg/dL — AB (ref 8.9–10.3)
CO2: 27 mmol/L (ref 22–32)
CREATININE: 0.92 mg/dL (ref 0.44–1.00)
Chloride: 101 mmol/L (ref 101–111)
GFR calc Af Amer: >60 mL/min (ref >60)
GFR calc non Af Amer: >60 mL/min (ref >60)
GLUCOSE: 364 mg/dL — AB (ref 65–99)
Potassium: 3.6 mmol/L (ref 3.5–5.1)
Sodium: 135 mmol/L (ref 135–145)

## 2016-05-31 LAB — CBC
HCT: 39.6 % (ref 35.0–47.0)
HEMOGLOBIN: 13 g/dL (ref 12.0–16.0)
MCH: 28.7 pg (ref 26.0–34.0)
MCHC: 32.9 g/dL (ref 32.0–36.0)
MCV: 87.3 fL (ref 80.0–100.0)
PLATELETS: 331 10*3/uL (ref 150–440)
RBC: 4.53 MIL/uL (ref 3.80–5.20)
RDW: 13.7 % (ref 11.5–14.5)
WBC: 8.3 10*3/uL (ref 3.6–11.0)

## 2016-05-31 LAB — TROPONIN I: Troponin I: <0.03 ng/mL (ref <0.03)

## 2016-05-31 MED ORDER — ACETAMINOPHEN 325 MG PO TABS
650.0000 mg | ORAL_TABLET | Freq: Once | ORAL | Status: AC
  Administered 2016-05-31: 650 mg via ORAL

## 2016-05-31 MED ORDER — IOPAMIDOL (ISOVUE-370) INJECTION 76%
75.0000 mL | Freq: Once | INTRAVENOUS | Status: AC | PRN
  Administered 2016-05-31: 75 mL via INTRAVENOUS

## 2016-05-31 MED ORDER — NITROFURANTOIN MONOHYD MACRO 100 MG PO CAPS: 100.0000 mg | ORAL_CAPSULE | Freq: Two times a day (BID) | ORAL | 0 refills | Status: AC

## 2016-05-31 MED ORDER — SODIUM CHLORIDE 0.9 % IV BOLUS (SEPSIS)
1000.0000 mL | Freq: Once | INTRAVENOUS | Status: AC
  Administered 2016-05-31: 1000 mL via INTRAVENOUS

## 2016-05-31 MED ORDER — ACETAMINOPHEN 325 MG PO TABS
ORAL_TABLET | ORAL | Status: AC
  Administered 2016-05-31: 650 mg via ORAL
  Filled 2016-05-31: qty 2

## 2016-05-31 MED ORDER — NITROFURANTOIN MONOHYD MACRO 100 MG PO CAPS
100.0000 mg | ORAL_CAPSULE | Freq: Once | ORAL | Status: AC
  Administered 2016-05-31: 100 mg via ORAL
  Filled 2016-05-31: qty 1

## 2016-05-31 NOTE — ED Provider Notes (Addendum)
[facility] Emergency Department Provider Note  ____________________________________________   First MD Initiated Contact with Patient 05/31/16 1529     (approximate)  I have reviewed the triage vital signs and the nursing notes.   HISTORY  Chief Complaint Hypertension   HPI Michelle [name] is a 60 y.o. female with a history of hypertension, diabetes and stroke in her family who is presenting to the emergency department today with right-sided facial numbness and tingling over the past day. She saw her primary care doctor today at the [facility] clinic who had been following both her A1c as well as her blood pressure. The patient was referred to the emergency department when she was found to be hypertensive with the intermittent numbness and tingling to the right side of the face. She denies any weakness or numbness to her extremities. Denies any chest pain or shortness of breath. Says that she takes a daily aspirin in addition to her lisinopril, 40 mg as well as metformin. Denies any symptoms at this time. Said that the right-sided facial numbness lasted for several hours, intermittently, yesterday as well as this morning but she is a symptomatic at this time.  Patient without any chest pain but did say yesterday she had shooting arm pain down her left side that lasted for several minutes and then resolved.    Past Medical History:  Diagnosis Date  . Asthma   . Depression 2015  . Diabetes type 2, uncontrolled (HCC) 2015  . Hypertension     There are no active problems to display for this patient.   Past Surgical History:  Procedure Laterality Date  . ABDOMINAL HYSTERECTOMY  1998    Prior to Admission medications   Medication Sig Start Date End Date Taking? Authorizing Provider  albuterol (PROVENTIL HFA;VENTOLIN HFA) 108 (90 BASE) MCG/ACT inhaler Inhale 2 puffs into the lungs every 4 (four) hours as needed for wheezing or shortness of breath.  12/08/14   Clancy Gourd, NP  benzonatate (TESSALON) 100 MG capsule Take 1 capsule (100 mg total) by mouth every 8 (eight) hours. 12/08/14   Clancy Gourd, NP  citalopram (CELEXA) 10 MG tablet Take 10 mg by mouth daily.    Historical Provider, MD  erythromycin ophthalmic ointment Place 1 application into both eyes 4 (four) times daily. For seven days 05/14/16   Renford Dills, NP  insulin glargine (LANTUS) 100 UNIT/ML injection Inject 75 Units into the skin at bedtime.    Historical Provider, MD  lisinopril (PRINIVIL,ZESTRIL) 10 MG tablet Take 10 mg by mouth daily.    Historical Provider, MD  metFORMIN (GLUCOPHAGE) 1000 MG tablet Take 1,000 mg by mouth daily with breakfast.    Historical Provider, MD  sulfamethoxazole-trimethoprim (BACTRIM DS,SEPTRA DS) 800-160 MG per tablet Take 1 tablet by mouth 2 (two) times daily. 04/24/15   Rockne Menghini, MD    Allergies Ciprofloxacin; Morphine and related; and Penicillins  Family History  Problem Relation Age of Onset  . Hypertension Mother   . Heart disease Mother   . Kidney disease Father   . Heart disease Father   . Hypertension Father     Social History Social History  Substance Use Topics  . Smoking status: Former Smoker    Quit date: 08/19/2013  . Smokeless tobacco: Never Used  . Alcohol use No    Review of Systems Constitutional: No fever/chills Eyes: No visual changes. ENT: No sore throat. Cardiovascular: Denies chest pain. Respiratory: Denies shortness of breath. Gastrointestinal: No abdominal pain.  No nausea, no vomiting.  No diarrhea.  No constipation. Genitourinary: Mild burning with urination. Musculoskeletal: Negative for back pain. Skin: Negative for rash. Neurological: Negative for headaches, focal weakness   10-point ROS otherwise negative.  ____________________________________________   PHYSICAL EXAM:  VITAL SIGNS: ED Triage Vitals  Enc Vitals Group     BP 05/31/16 1516 (!) 187/110     Pulse Rate  05/31/16 1516 84     Resp 05/31/16 1516 18     Temp 05/31/16 1516 98.2 F (36.8 C)     Temp Source 05/31/16 1516 Oral     SpO2 05/31/16 1516 95 %     Weight 05/31/16 1517 224 lb (101.6 kg)     Height 05/31/16 1517 5\' 4"  (1.626 m)     Head Circumference --      Peak Flow --      Pain Score 05/31/16 1517 6     Pain Loc --      Pain Edu? --      Excl. in GC? --     Constitutional: Alert and oriented. Well appearing and in no acute distress. Eyes: Conjunctivae are normal. PERRL. EOMI. Head: Atraumatic. Nose: No congestion/rhinnorhea. Mouth/Throat: Mucous membranes are moist.  Neck: No stridor.   Cardiovascular: Normal rate, regular rhythm. Grossly normal heart sounds.  Good peripheral circulation with intact, equal bilateral radial as well as dorsalis pedis pulses. Respiratory: Normal respiratory effort.  No retractions. Lungs CTAB. Gastrointestinal: Soft and nontender. No distention. No abdominal bruits. No CVA tenderness. Musculoskeletal: No lower extremity tenderness nor edema.  No joint effusions. Neurologic:  Normal speech and language. No gross focal neurologic deficits are appreciated. No gait instability. Skin:  Skin is warm, dry and intact. No rash noted. Psychiatric: Mood and affect are normal. Speech and behavior are normal.  ____________________________________________   LABS (all labs ordered are listed, but only abnormal results are displayed)  Labs Reviewed  BASIC METABOLIC PANEL - Abnormal; Notable for the following:       Result Value   Glucose, Bld 364 (*)    Calcium 8.7 (*)    All other components within normal limits  CBC  TROPONIN I  URINALYSIS COMPLETEWITH MICROSCOPIC ([facility] ONLY)  CBG MONITORING, ED   ____________________________________________  EKG  ED ECG REPORT I, Arelia Longest, the attending physician, personally viewed and interpreted this ECG.   Date: 05/31/2016  EKG Time: 1523  Rate: 85  Rhythm: normal sinus rhythm  Axis:  Normal  Intervals:none  ST&T Change: No ST elevation or depression. No abnormal T-wave inversion.  ____________________________________________  RADIOLOGY  CT Head Wo Contrast (Accession [id number]) (Order [id number])  Imaging  Date: 05/31/2016 Department: [facility] EMERGENCY DEPARTMENT Released By/Authorizing: Myrna Blazer, MD (auto-released)  Exam Information   Status Exam Begun  Exam Ended   Final ----------------------------------------- 7:24 PM on 05/31/2016 -----------------------------------------   05/31/2016 4:24 PM 05/31/2016 4:26 PM  PACS Images   Show images for CT Head Wo Contrast  Study Result   CLINICAL DATA:  High blood pressure.  Dizziness.  EXAM: CT HEAD WITHOUT CONTRAST  TECHNIQUE: Contiguous axial images were obtained from the base of the skull through the vertex without intravenous contrast.  COMPARISON:  05/16/2013  FINDINGS: Brain: No evidence of acute infarction, hemorrhage, hydrocephalus, extra-axial collection or mass lesion/mass effect.  Vascular: No hyperdense vessel or unexpected calcification.  Skull: No osseous abnormality.  Sinuses/Orbits: Visualized paranasal sinuses are clear. Visualized mastoid sinuses are clear. Visualized orbits demonstrate no focal  abnormality.  Other: None  IMPRESSION: 1. No acute intracranial pathology.   Electronically Signed   By: Elige Ko   On: 05/31/2016 16:29     DG Chest 1 View (Accession [id number]) (Order [id number])  Imaging  Date: 05/31/2016 Department: [facility] EMERGENCY DEPARTMENT Released By/Authorizing: Myrna Blazer, MD (auto-released)  Exam Information   Status Exam Begun  Exam Ended   Final [99] 05/31/2016 4:25 PM 05/31/2016 4:39 PM  PACS Images   Show images for DG Chest 1 View  Study Result   CLINICAL DATA:  Right facial numbness  EXAM: CHEST 1 VIEW  COMPARISON:  12/08/2014 chest  radiograph.  FINDINGS: Stable cardiomediastinal silhouette with normal heart size. No pneumothorax. No pleural effusion. Lungs appear clear, with no acute consolidative airspace disease and no pulmonary edema.  IMPRESSION: No active disease.   Electronically Signed   By: Delbert Phenix M.D.   On: 05/31/2016 16:42     __CT Angio Head W or Wo Contrast (Accession [id number]) (Order [id number])  Imaging  Date: 05/31/2016 Department: [facility] EMERGENCY DEPARTMENT Released By/Authorizing: Myrna Blazer, MD (auto-released)  Exam Information   Status Exam Begun  Exam Ended   Final [99] 05/31/2016 9:16 PM 05/31/2016 9:31 PM  PACS Images   Show images for CT Angio Head W or Wo Contrast  Study Result   CLINICAL DATA:  Initial evaluation for acute right-sided facial numbness.  EXAM: CT ANGIOGRAPHY HEAD AND NECK  TECHNIQUE: Multidetector CT imaging of the head and neck was performed using the standard protocol during bolus administration of intravenous contrast. Multiplanar CT image reconstructions and MIPs were obtained to evaluate the vascular anatomy. Carotid stenosis measurements (when applicable) are obtained utilizing NASCET criteria, using the distal internal carotid diameter as the denominator.  CONTRAST:  75 cc of Isovue 370.  COMPARISON:  Prior noncontrast head CT from earlier the same day.  FINDINGS: CTA NECK FINDINGS  Aortic arch: Visualized aorta is of normal caliber with normal branch pattern. No high-grade stenosis at the origin of the great vessels. Visualized subclavian arteries are widely patent and normal.  Right carotid system: Right common carotid artery widely patent from the origin to the bifurcation. Right ICA widely patent from knee bifurcation to the skullbase. No stenosis, dissection, or vascular occlusion within the right carotid artery system.  Left carotid system: Left common carotid artery  patent from its origin to the bifurcation. Left ICA widely patent from the bifurcation to the skullbase. No significant stenosis, dissection, or vascular occlusion within the left carotid artery system. Mild eccentric atheromatous plaque noted within the proximal left ICA without stenosis (series 6, image 28).  Vertebral arteries: Both of the vertebral arteries arise from the subclavian arteries. Vertebral arteries widely patent within the neck without stenosis, dissection, or occlusion.  Skeleton: No acute osseus abnormality. No worrisome lytic or blastic osseous lesions.  Other neck: No acute soft tissue abnormality within the neck. No adenopathy. Salivary gland normal limits. Thyroid normal limits. Moderate mucosal thickening noted within the right maxillary sinus with scattered opacity within the ethmoidal air cells and right sphenoid sinus. Changes are likely chronic.  Upper chest: Visualized mediastinum within normal limits. Visualized lungs demonstrate no acute process. 6 mm nodule within the peripheral right upper lobe noted (series 4, image 12). Indeterminate.  Review of the MIP images confirms the above findings  CTA HEAD FINDINGS  Anterior circulation: Petrous segments widely patent bilaterally. Mild scattered atheromatous plaque within the cavernous ICAs without flow-limiting  stenosis. And 1 segments patent. Anterior communicating artery normal. Anterior cerebral arteries well opacified to their distal aspects. M1 segments patent without stenosis or occlusion. MCA bifurcations normal. No proximal M2 occlusion. Distal MCA branches well opacified and symmetric.  Posterior circulation: Vertebral arteries widely patent to the vertebrobasilar junction. Posterior inferior cerebral arteries patent bilaterally. Basilar artery widely patent to its distal aspect. Superior cerebellar arteries patent bilaterally. Both of the posterior cerebral arteries largely  supplied via the basilar artery and are well opacified to their distal aspects.  Venous sinuses: Patent.  The no significant anatomic variant.  Anatomic variants: No aneurysm or vascular malformation.  Delayed phase: No pathologic enhancement.  Review of the MIP images confirms the above findings  IMPRESSION: 1. Negative CTA of the head and neck. No emergent large vessel occlusion. No high-grade or correctable stenosis. 2. Mild atheromatous plaque within the carotid siphons without flow limiting stenosis. 3. Widely patent arterial vasculature within the neck. No significant stenosis identified. 4. **An incidental finding of potential clinical significance has been found. 6 mm right upper lobe pulmonary nodule, indeterminate. Non-contrast chest CT at 6-12 months is recommended. If the nodule is stable at time of repeat CT, then future CT at 18-24 months (from today's scan) is considered optional for low-risk patients, but is recommended for high-risk patients. This recommendation follows the consensus statement: Guidelines for Management of Incidental Pulmonary Nodules Detected on CT Images: From the Fleischner Society 2017; Radiology 2017; 284:228-243.**   Electronically Signed   By: Rise MuBenjamin  McClintock M.D.   On: 05/31/2016 21:55    __________________________________________   PROCEDURES  Procedure(s) performed:   Procedures  Critical Care performed:   ____________________________________________   INITIAL IMPRESSION / ASSESSMENT AND PLAN / ED COURSE  Pertinent labs & imaging results that were available during my care of the patient were reviewed by me and considered in my medical decision making (see chart for details).  ----------------------------------------- 7:24 PM on 05/31/2016 -----------------------------------------  Discussed case with neurologist, Dr. Amada JupiterKirkpatrick who recommends the tele-neurology consult. I explained this to the patient  as well as her lab results and she is understanding willing to comply. We'll also repeat troponin secondary to one episode of left arm pain with this facial numbness.  Clinical Course   ----------------------------------------- 8:10 PM on 05/31/2016 -----------------------------------------  Discussed case with the specialist on-call neurologist who recommends an MRI/MRA. However, the patient says that she is claustrophobic and unable to tolerate MRIs. We will proceed with a CT angiography of the head and neck which I also discussed with the specialist on-call neurologist. If these are negative the patient will be discharged home and follow-up as an outpatient with her primary care doctor. She'll continue on her blood pressure medications as well as her aspirin daily.  ----------------------------------------- 10:47 PM on 05/31/2016 -----------------------------------------  Patient resting comfortably and continues to be asymptomatic. CTA was reassuring. Discussed the case with the tele-neurologist, Dr. De BlanchMrelashivili.  Patient likely with TIA. Will be discharged to home and continue her aspirin as well as lisinopril. She has already established an appointment to follow up with her primary care doctor in 1 week. Furthermore, her urine came back suspicious for infection. She does say that she is having some burning with urination. We'll discharge also with antibiotics. She understands the plan and is willing to comply.  ____________________________________________   FINAL CLINICAL IMPRESSION(S) / ED DIAGNOSES  TIA. Uncontrolled hypertension. UTI.    NEW MEDICATIONS STARTED DURING THIS VISIT:  New Prescriptions   No medications on  file     Note:  This document was prepared using Dragon voice recognition software and may include unintentional dictation errors.    Myrna Blazer, MD 05/31/16 2249  Patient appears to have her blood pressure trending downward. She will  follow-up with her primary care doctor for further management of her blood pressure.    Myrna Blazer, MD 05/31/16 2253  Prior to discharge we also discussed the patient's pulmonary nodule which is found on the CAT scan. The patient says that she has been followed for these nodules in the past and is aware of the presence. She will follow-up with her primary care doctor.    Myrna Blazer, MD 06/01/16 [phone]

## 2016-05-31 NOTE — ED Triage Notes (Signed)
Pt states high blood pressure for past 3 days associated with dizziness. Pt seen at [facility] earlier in week and told her A1C was high also. Pt alert and oriented X4, active, cooperative, pt in NAD. RR even and unlabored, color WNL.  Pt drove herself here.

## 2016-08-07 ENCOUNTER — Encounter: Payer: Self-pay | Admitting: *Deleted

## 2016-08-07 ENCOUNTER — Ambulatory Visit
Admission: EM | Admit: 2016-08-07 | Discharge: 2016-08-07 | Disposition: A | Discharge status: Home / Self Care | Payer: Medicaid Other | Attending: Family Medicine | Admitting: Family Medicine

## 2016-08-07 DIAGNOSIS — H1011 Acute atopic conjunctivitis, right eye: Secondary | ICD-10-CM | POA: Diagnosis not present

## 2016-08-07 MED ORDER — EPINASTINE HCL 0.05 % OP SOLN: 1.0000 [drp] | Freq: Two times a day (BID) | OPHTHALMIC | 12 refills | Status: DC

## 2016-08-07 MED ORDER — KETOTIFEN FUMARATE 0.025 % OP SOLN: 1.0000 [drp] | Freq: Two times a day (BID) | OPHTHALMIC | 0 refills | Status: DC

## 2016-08-07 MED ORDER — FEXOFENADINE HCL 60 MG PO TABS: 60.0000 mg | ORAL_TABLET | Freq: Two times a day (BID) | ORAL | 0 refills | Status: DC

## 2016-08-07 NOTE — ED Triage Notes (Signed)
Patient started having symptoms of right eye pain, and redness 4 days ago.

## 2016-08-07 NOTE — ED Provider Notes (Signed)
CSN: [id number]     Arrival date & time 08/07/16  1131 History   First MD Initiated Contact with Patient 08/07/16 1328     Chief Complaint  Patient presents with  . Conjunctivitis   (Consider location/radiation/quality/duration/timing/severity/associated sxs/prior Treatment) HPI  This a 61 year old female who presents with an itchy burning right eye that occurred initially 4 days ago. States that she has a small amount of burning type pain in her eye. He has not had any matting. Denies any visual disturbances or any sensitivity to light. She does not wear contacts. He was treated for a bacterial conjunctivitis in October which she states improved. She states that 4 days ago she had what she thought was an allergic reaction with redness over her chest and itchy and  intensely itchy eyes at that time. The itchiness has decreased somewhat but is still present. She does not feel as though she has a foreign body in her eye. She denies having any floater sensations. She does work in a childcare center.       Past Medical History:  Diagnosis Date  . Asthma   . Depression 2015  . Diabetes type 2, uncontrolled (HCC) 2015  . Hypertension    Past Surgical History:  Procedure Laterality Date  . ABDOMINAL HYSTERECTOMY  1998   Family History  Problem Relation Age of Onset  . Hypertension Mother   . Heart disease Mother   . Kidney disease Father   . Heart disease Father   . Hypertension Father    Social History  Substance Use Topics  . Smoking status: Former Smoker    Quit date: 08/19/2013  . Smokeless tobacco: Never Used  . Alcohol use No   OB History    Gravida Para Term Preterm AB Living   5 4           SAB TAB Ectopic Multiple Live Births                 Review of Systems  Constitutional: Negative for activity change, chills, fatigue and fever.  Eyes: Positive for pain, discharge, redness and itching. Negative for photophobia and visual disturbance.  All other systems reviewed  and are negative.   Allergies  Ciprofloxacin; Morphine and related; and Penicillins  Home Medications   Prior to Admission medications   Medication Sig Start Date End Date Taking? Authorizing Provider  albuterol (PROVENTIL HFA;VENTOLIN HFA) 108 (90 BASE) MCG/ACT inhaler Inhale 2 puffs into the lungs every 4 (four) hours as needed for wheezing or shortness of breath. 12/08/14  Yes Jeanette Defelice, NP  citalopram (CELEXA) 10 MG tablet Take 10 mg by mouth daily.   Yes Historical Provider, MD  insulin glargine (LANTUS) 100 UNIT/ML injection Inject 75 Units into the skin at bedtime.   Yes Historical Provider, MD  lisinopril (PRINIVIL,ZESTRIL) 10 MG tablet Take 10 mg by mouth daily.   Yes Historical Provider, MD  metFORMIN (GLUCOPHAGE) 1000 MG tablet Take 1,000 mg by mouth daily with breakfast.   Yes Historical Provider, MD  Epinastine HCl 0.05 % ophthalmic solution Place 1 drop into the right eye 2 (two) times daily. 08/07/16   Lutricia Feil, PA-C  erythromycin ophthalmic ointment Place 1 application into both eyes 4 (four) times daily. For seven days 05/14/16   Renford Dills, NP  fexofenadine (ALLEGRA) 60 MG tablet Take 1 tablet (60 mg total) by mouth 2 (two) times daily. 08/07/16   Lutricia Feil, PA-C  ketotifen (ZADITOR) 0.025 % ophthalmic solution Place  1 drop into the right eye 2 (two) times daily. 08/07/16   Lutricia FeilWilliam P Maresha Anastos, PA-C  sulfamethoxazole-trimethoprim (BACTRIM DS,SEPTRA DS) 800-160 MG per tablet Take 1 tablet by mouth 2 (two) times daily. 04/24/15   Rockne MenghiniAnne-Caroline Norman, MD   Meds Ordered and Administered this Visit  Medications - No data to display  BP (!) 187/107 (BP Location: Right Wrist)   Pulse 92   Temp 97.8 F (36.6 C) (Oral)   Resp 16   Ht 5\' 4"  (1.626 m)   Wt 222 lb (100.7 kg)   SpO2 99%   BMI 38.11 kg/m  No data found.   Physical Exam  Constitutional: She is oriented to person, place, and time. She appears well-developed and well-nourished. No distress.   HENT:  Head: Normocephalic and atraumatic.  Right Ear: External ear normal.  Left Ear: External ear normal.  Nose: Nose normal.  Mouth/Throat: Oropharynx is clear and moist.  Eyes: EOM are normal. Pupils are equal, round, and reactive to light. Right eye exhibits discharge. Left eye exhibits no discharge.  Examination of the right eye shows a visual acuity to be normal as noted in the chart. PERRLA. EOMs are full and intact. Conjunctiva is injected but not erythematous. There is a clear liquid discharge present from her eye. Adding is seen. The eyelid was everted with the findings of no foreign bodies or any abnormalities seen.  Neck: Normal range of motion. Neck supple.  Musculoskeletal: Normal range of motion.  Neurological: She is alert and oriented to person, place, and time.  Skin: Skin is warm and dry. She is not diaphoretic.  Psychiatric: She has a normal mood and affect. Her behavior is normal. Judgment and thought content normal.  Nursing note and vitals reviewed.   Urgent Care Course   Clinical Course     Procedures (including critical care time)  Labs Review Labs Reviewed - No data to display  Imaging Review No results found.   Visual Acuity Review  Right Eye Distance: 20/30 (uncorrected) Left Eye Distance: 20/25 (uncorrected) Bilateral Distance: 20/20 (uncorrected)  Right Eye Near:   Left Eye Near:    Bilateral Near:         MDM   1. Acute atopic conjunctivitis of right eye    Discharge Medication List as of 08/07/2016  1:50 PM    START taking these medications   Details  Epinastine HCl 0.05 % ophthalmic solution Place 1 drop into the right eye 2 (two) times daily., Starting Tue 08/07/2016, Normal    fexofenadine (ALLEGRA) 60 MG tablet Take 1 tablet (60 mg total) by mouth 2 (two) times daily., Starting Tue 08/07/2016, Normal    ketotifen (ZADITOR) 0.025 % ophthalmic solution Place 1 drop into the right eye 2 (two) times daily., Starting Tue 08/07/2016,  Normal      Plan: 1. Test/x-ray results and diagnosis reviewed with patient 2. rx as per orders; risks, benefits, potential side effects reviewed with patient 3. Recommend supportive treatment with   Cool compresses for comfort. Use medications with 15 minutes between. Drops. She is not improving in 2 days she should follow-up with [facility] 4. F/u prn if symptoms worsen or don't improve     Lutricia FeilWilliam P Demarques Pilz, PA-C 08/07/16 1406

## 2016-09-04 ENCOUNTER — Ambulatory Visit
Admission: EM | Admit: 2016-09-04 | Discharge: 2016-09-04 | Disposition: A | Discharge status: Home / Self Care | Payer: Medicaid Other | Attending: Family Medicine | Admitting: Family Medicine

## 2016-09-04 DIAGNOSIS — J111 Influenza due to unidentified influenza virus with other respiratory manifestations: Secondary | ICD-10-CM

## 2016-09-04 DIAGNOSIS — R69 Illness, unspecified: Secondary | ICD-10-CM | POA: Diagnosis not present

## 2016-09-04 MED ORDER — BENZONATATE 100 MG PO CAPS: 100.0000 mg | ORAL_CAPSULE | Freq: Three times a day (TID) | ORAL | 0 refills | Status: DC | PRN

## 2016-09-04 MED ORDER — OSELTAMIVIR PHOSPHATE 75 MG PO CAPS: 75.0000 mg | ORAL_CAPSULE | Freq: Two times a day (BID) | ORAL | 0 refills | Status: DC

## 2016-09-04 NOTE — ED Triage Notes (Signed)
Pt c/o cough, chills, body aches, fevers, shortness of breath. Patient states that symptoms started on Sunday and worsened yesterday.

## 2016-09-04 NOTE — ED Provider Notes (Signed)
[facility]    CSN: [id number] Arrival date & time: 09/04/16  1046     History   Chief Complaint Chief Complaint  Patient presents with  . Cough    HPI Michelle [name] is a 61 y.o. female.   The history is provided by the patient.  Cough  Associated symptoms: fever, myalgias and wheezing   URI  Presenting symptoms: congestion, cough, fatigue and fever   Severity:  Moderate Onset quality:  Sudden Duration:  3 days Timing:  Constant Progression:  Worsening Chronicity:  New Relieved by:  Nothing Ineffective treatments:  OTC medications Associated symptoms: myalgias and wheezing   Risk factors: chronic respiratory disease (asthma), diabetes mellitus and sick contacts (flu contact at work)   Risk factors: not elderly, no chronic cardiac disease, no chronic kidney disease, no immunosuppression, no recent illness and no recent travel     Past Medical History:  Diagnosis Date  . Asthma   . Depression 2015  . Diabetes type 2, uncontrolled (HCC) 2015  . Hypertension     There are no active problems to display for this patient.   Past Surgical History:  Procedure Laterality Date  . ABDOMINAL HYSTERECTOMY  1998    OB History    Gravida Para Term Preterm AB Living   5 4           SAB TAB Ectopic Multiple Live Births                   Home Medications    Prior to Admission medications   Medication Sig Start Date End Date Taking? Authorizing Provider  albuterol (PROVENTIL HFA;VENTOLIN HFA) 108 (90 BASE) MCG/ACT inhaler Inhale 2 puffs into the lungs every 4 (four) hours as needed for wheezing or shortness of breath. 12/08/14  Yes Jeanette Defelice, NP  citalopram (CELEXA) 10 MG tablet Take 10 mg by mouth daily.   Yes Historical Provider, MD  fexofenadine (ALLEGRA) 60 MG tablet Take 1 tablet (60 mg total) by mouth 2 (two) times daily. 08/07/16  Yes Lutricia Feil, PA-C  insulin glargine (LANTUS) 100 UNIT/ML injection Inject 75 Units into the skin  at bedtime.   Yes Historical Provider, MD  lisinopril (PRINIVIL,ZESTRIL) 10 MG tablet Take 10 mg by mouth daily.   Yes Historical Provider, MD  metFORMIN (GLUCOPHAGE) 1000 MG tablet Take 1,000 mg by mouth daily with breakfast.   Yes Historical Provider, MD  benzonatate (TESSALON) 100 MG capsule Take 1 capsule (100 mg total) by mouth 3 (three) times daily as needed for cough. 09/04/16   Payton Mccallum, MD  Epinastine HCl 0.05 % ophthalmic solution Place 1 drop into the right eye 2 (two) times daily. 08/07/16   Lutricia Feil, PA-C  erythromycin ophthalmic ointment Place 1 application into both eyes 4 (four) times daily. For seven days 05/14/16   Renford Dills, NP  ketotifen (ZADITOR) 0.025 % ophthalmic solution Place 1 drop into the right eye 2 (two) times daily. 08/07/16   Lutricia Feil, PA-C  oseltamivir (TAMIFLU) 75 MG capsule Take 1 capsule (75 mg total) by mouth 2 (two) times daily. 09/04/16   Payton Mccallum, MD  sulfamethoxazole-trimethoprim (BACTRIM DS,SEPTRA DS) 800-160 MG per tablet Take 1 tablet by mouth 2 (two) times daily. 04/24/15   Rockne Menghini, MD    Family History Family History  Problem Relation Age of Onset  . Hypertension Mother   . Heart disease Mother   . Kidney disease Father   . Heart disease  Father   . Hypertension Father     Social History Social History  Substance Use Topics  . Smoking status: Former Smoker    Quit date: 08/19/2013  . Smokeless tobacco: Never Used  . Alcohol use No     Allergies   Ciprofloxacin; Morphine and related; and Penicillins   Review of Systems Review of Systems  Constitutional: Positive for fatigue and fever.  HENT: Positive for congestion.   Respiratory: Positive for cough and wheezing.   Musculoskeletal: Positive for myalgias.     Physical Exam Triage Vital Signs ED Triage Vitals  Enc Vitals Group     BP 09/04/16 1112 (!) 175/96     Pulse Rate 09/04/16 1112 82     Resp 09/04/16 1112 18     Temp 09/04/16 1112  97.5 F (36.4 C)     Temp Source 09/04/16 1112 Oral     SpO2 09/04/16 1112 100 %     Weight 09/04/16 1111 222 lb (100.7 kg)     Height 09/04/16 1111 5\' 4"  (1.626 m)     Head Circumference --      Peak Flow --      Pain Score 09/04/16 1112 8     Pain Loc --      Pain Edu? --      Excl. in GC? --    No data found.   Updated Vital Signs BP (!) 175/96 (BP Location: Left Arm)   Pulse 82   Temp 97.5 F (36.4 C) (Oral)   Resp 18   Ht 5\' 4"  (1.626 m)   Wt 222 lb (100.7 kg)   SpO2 100%   BMI 38.11 kg/m   Visual Acuity Right Eye Distance:   Left Eye Distance:   Bilateral Distance:    Right Eye Near:   Left Eye Near:    Bilateral Near:     Physical Exam  Constitutional: She appears well-developed and well-nourished. No distress.  HENT:  Head: Normocephalic and atraumatic.  Right Ear: Tympanic membrane, external ear and ear canal normal.  Left Ear: Tympanic membrane, external ear and ear canal normal.  Nose: Mucosal edema and rhinorrhea present. No nose lacerations, sinus tenderness, nasal deformity, septal deviation or nasal septal hematoma. No epistaxis.  No foreign bodies. Right sinus exhibits no maxillary sinus tenderness and no frontal sinus tenderness. Left sinus exhibits no maxillary sinus tenderness and no frontal sinus tenderness.  Mouth/Throat: Uvula is midline, oropharynx is clear and moist and mucous membranes are normal. No oropharyngeal exudate.  Eyes: Conjunctivae and EOM are normal. Pupils are equal, round, and reactive to light. Right eye exhibits no discharge. Left eye exhibits no discharge. No scleral icterus.  Neck: Normal range of motion. Neck supple. No thyromegaly present.  Cardiovascular: Normal rate, regular rhythm and normal heart sounds.   Pulmonary/Chest: Effort normal and breath sounds normal. No respiratory distress. She has no wheezes. She has no rales.  Lymphadenopathy:    She has no cervical adenopathy.  Skin: She is not diaphoretic.  Nursing  note and vitals reviewed.    UC Treatments / Results  Labs (all labs ordered are listed, but only abnormal results are displayed) Labs Reviewed - No data to display  EKG  EKG Interpretation None       Radiology No results found.  Procedures Procedures (including critical care time)  Medications Ordered in UC Medications - No data to display   Initial Impression / Assessment and Plan / UC Course  I have reviewed  the triage vital signs and the nursing notes.  Pertinent labs & imaging results that were available during my care of the patient were reviewed by me and considered in my medical decision making (see chart for details).      Final Clinical Impressions(s) / UC Diagnoses   Final diagnoses:  Influenza-like illness    New Prescriptions Discharge Medication List as of 09/04/2016 12:10 PM    START taking these medications   Details  benzonatate (TESSALON) 100 MG capsule Take 1 capsule (100 mg total) by mouth 3 (three) times daily as needed for cough., Starting Tue 09/04/2016, Normal    oseltamivir (TAMIFLU) 75 MG capsule Take 1 capsule (75 mg total) by mouth 2 (two) times daily., Starting Tue 09/04/2016, Normal       1. diagnosis reviewed with patient 2. rx as per orders above; reviewed possible side effects, interactions, risks and benefits  3. Recommend supportive treatment with rest, fluids 4. Follow-up prn if symptoms worsen or don't improve   Payton Mccallumrlando Tiyona Desouza, MD 09/04/16 1218

## 2016-12-26 DIAGNOSIS — N644 Mastodynia: Secondary | ICD-10-CM | POA: Insufficient documentation

## 2017-03-07 ENCOUNTER — Encounter: Payer: Self-pay | Admitting: *Deleted

## 2017-03-07 ENCOUNTER — Ambulatory Visit
Admission: EM | Admit: 2017-03-07 | Discharge: 2017-03-07 | Disposition: A | Discharge status: Home / Self Care | Payer: Medicaid Other | Attending: Emergency Medicine | Admitting: Emergency Medicine

## 2017-03-07 DIAGNOSIS — H00011 Hordeolum externum right upper eyelid: Secondary | ICD-10-CM

## 2017-03-07 NOTE — Discharge Instructions (Signed)
Start doing warm compresses 4 times a day. You may get some baby shampoo, dilute this with warm water, and gently wipe your upper and lower eyelid while you are taking a shower. This will help prevent the recurrence of any styes or chalazions. Follow up with ophthalmologist if this does not get better within several days. Try not to rub your eyes. You may use Systane as often as you want for comfort. Return immediately to the ER for fever above 100.4, if you have pain moving your eyes, any visual changes, nausea, vomiting, headaches, a rash around your eye, or any other concerns. ° °Go to www.goodrx.com to look up your medications. This will give you a list of where you can find your prescriptions at the most affordable prices. Or ask the pharmacist what the cash price is, or if they have any other discount programs available to help make your medication more affordable. This can be less expensive than what you would pay with insurance.   °

## 2017-03-07 NOTE — ED Triage Notes (Signed)
Right eye pain and drainage. Seen [facility] ED last week for same complaint.

## 2017-03-07 NOTE — ED Provider Notes (Signed)
HPI  SUBJECTIVE:   Michelle [name] is a 61 y.o. female who presents with right upper eyelid pain described as sore, constant for the past 2-3 days. She reports swelling along this eyelid. She reports blurry vision as difficulty focusing, as if she has a film over her eye for the past 2 or 3 days as well. This is constant. States that her eyes itch and that she's been rubbing her eyes. She reports increased tearing. Seen in the [facility] ED on 7/25 for cough and eye drainage. Thought to have viral uri with conjunctivitis sent home with erythromycin ointment. States the drainage has gotten much better. Is also doing warm compresses with improvement in her symptoms. No aggravating factors. No purulent drainage, nausea, vomiting, fevers, headaches, facial rash, photophobia, halos around lights. No eye pain. No eye pain worse in the dark. No periorbital erythema, edema, No trauma to her eye, exposure to chemicals. No allergy type symptoms. She does not wear glasses or contacts. She states that the vision in her right eye is usually worse than the left at her baseline. Past medical history of diabetes, asthma, hypertension, hypercholesterolemia. No history of glaucoma, allergies, sickle cell disease. [place]hthal[place] In [place]. PMD: [facility]  Past Medical History:  Diagnosis Date  . Asthma   . Depression 2015  . Diabetes type 2, uncontrolled (HCC) 2015  . Hypertension     Past Surgical History:  Procedure Laterality Date  . ABDOMINAL HYSTERECTOMY  1998    Family History  Problem Relation Age of Onset  . Hypertension Mother   . Heart disease Mother   . Kidney disease Father   . Heart disease Father   . Hypertension Father     Social History  Substance Use Topics  . Smoking status: Former Smoker    Quit date: 08/19/2013  . Smokeless tobacco: Never Used  . Alcohol use No    No current facility-administered medications for this encounter.   Current Outpatient Prescriptions:  .   albuterol (PROVENTIL HFA;VENTOLIN HFA) 108 (90 BASE) MCG/ACT inhaler, Inhale 2 puffs into the lungs every 4 (four) hours as needed for wheezing or shortness of breath., Disp: 1 Inhaler, Rfl: 0 .  erythromycin ophthalmic ointment, Place 1 application into both eyes 4 (four) times daily. For seven days, Disp: 3.5 g, Rfl: 0 .  insulin glargine (LANTUS) 100 UNIT/ML injection, Inject 75 Units into the skin at bedtime., Disp: , Rfl:  .  ketotifen (ZADITOR) 0.025 % ophthalmic solution, Place 1 drop into the right eye 2 (two) times daily., Disp: 5 mL, Rfl: 0 .  lisinopril (PRINIVIL,ZESTRIL) 10 MG tablet, Take 10 mg by mouth daily., Disp: , Rfl:  .  metFORMIN (GLUCOPHAGE) 1000 MG tablet, Take 1,000 mg by mouth daily with breakfast., Disp: , Rfl:  .  fexofenadine (ALLEGRA) 60 MG tablet, Take 1 tablet (60 mg total) by mouth 2 (two) times daily., Disp: 15 tablet, Rfl: 0  Allergies  Allergen Reactions  . Ciprofloxacin Itching  . Morphine And Related Hives  . Penicillins Hives     ROS  As noted in HPI.   Physical Exam  BP (!) 164/96 (BP Location: Left Arm)   Pulse 94   Temp 98.3 F (36.8 C) (Oral)   Resp 16   Ht 5\' 4"  (1.626 m)   Wt 224 lb (101.6 kg)   SpO2 98%   BMI 38.45 kg/m   Constitutional: Well developed, well nourished, no acute distress Eyes: PERRLA, EOMI, conjunctiva normal bilaterally. No direct  or consensual photophobia. Positive tender incoming sty right upper eyelid. No pointing or drainage. No eyelid swelling. No periorbital erythema, tenderness, edema. No foreign body seen on upper and lower lid eversion. No corneal foreign body or abrasion seen on flourescin exam.   Visual Acuity  Right Eye Distance: 20/50 Left Eye Distance: 20/25 Bilateral Distance: 20/25  Right Eye Near:   Left Eye Near:    Bilateral Near:    HENT: Normocephalic, atraumatic,mucus membranes moist Respiratory: Normal inspiratory effort Cardiovascular: Normal rate GI: nondistended skin: No facial  rash, skin intact Musculoskeletal: no deformities Neurologic: Alert & oriented x 3, no focal neuro deficits Psychiatric: Speech and behavior appropriate   ED Course   Medications - No data to display  No orders of the defined types were placed in this encounter.   No results found for this or any previous visit (from the past 24 hour(s)). No results found.  ED Clinical Impression  Hordeolum externum of right upper eyelid   ED Assessment/Plan  Outside ER records reviewed. As noted in history of present illness.  Presentation consistent with incoming sty right upper eyelid. Patient already has erythromycin ointment. Advised continue warm compresses, lid hygiene with diluted baby shampoo, advised follow-up with an ophthalmologist to get glasses especially for driving. Patient states that the disparity in her visual acuity is her baseline and is not new.  Discussed MDM, plan and followup with patient. Patient agrees with plan.   No orders of the defined types were placed in this encounter.   *This clinic note was created using Dragon dictation software. Therefore, there may be occasional mistakes despite careful proofreading.  ?   Domenick GongMortenson, Camora Tremain, MD 03/07/17 [phone]

## 2017-04-15 ENCOUNTER — Ambulatory Visit
Admission: EM | Admit: 2017-04-15 | Discharge: 2017-04-15 | Disposition: A | Discharge status: Home / Self Care | Payer: Medicaid Other | Attending: Family Medicine | Admitting: Family Medicine

## 2017-04-15 DIAGNOSIS — H10023 Other mucopurulent conjunctivitis, bilateral: Secondary | ICD-10-CM

## 2017-04-15 DIAGNOSIS — H00011 Hordeolum externum right upper eyelid: Secondary | ICD-10-CM

## 2017-04-15 MED ORDER — SULFACETAMIDE SODIUM 10 % OP SOLN: 2.0000 [drp] | Freq: Four times a day (QID) | OPHTHALMIC | 0 refills | Status: DC

## 2017-04-15 NOTE — ED Triage Notes (Signed)
Patient complains of right and left eye pain/ drainage. Patient states that she was seen for this a few weeks back and feels like it has not completely improved.

## 2017-04-15 NOTE — ED Provider Notes (Signed)
[facility]    CSN: [id number] Arrival date & time: 04/15/17  1023     History   Chief Complaint Chief Complaint  Patient presents with  . Eye Pain    bilateral    HPI Michelle [name] is a 61 y.o. female.   61 yo female with a c/o right eye redness, drainage and right upper eyelid pain for several weeks. States had similar about 1 month ago which improved with eye drops/ointment, however did not completely resolve per patient.    The history is provided by the patient.  Eye Pain     Past Medical History:  Diagnosis Date  . Asthma   . Depression 2015  . Diabetes type 2, uncontrolled (HCC) 2015  . Hypertension     There are no active problems to display for this patient.   Past Surgical History:  Procedure Laterality Date  . ABDOMINAL HYSTERECTOMY  1998    OB History    Gravida Para Term Preterm AB Living   5 4           SAB TAB Ectopic Multiple Live Births                   Home Medications    Prior to Admission medications   Medication Sig Start Date End Date Taking? Authorizing Provider  albuterol (PROVENTIL HFA;VENTOLIN HFA) 108 (90 BASE) MCG/ACT inhaler Inhale 2 puffs into the lungs every 4 (four) hours as needed for wheezing or shortness of breath. 12/08/14  Yes Defelice, Para MarchJeanette, NP  fexofenadine (ALLEGRA) 60 MG tablet Take 1 tablet (60 mg total) by mouth 2 (two) times daily. 08/07/16  Yes Lutricia Feiloemer, William P, PA-C  insulin glargine (LANTUS) 100 UNIT/ML injection Inject 75 Units into the skin at bedtime.   Yes [provider]  lisinopril (PRINIVIL,ZESTRIL) 10 MG tablet Take 10 mg by mouth daily.   Yes [provider]  metFORMIN (GLUCOPHAGE) 1000 MG tablet Take 1,000 mg by mouth daily with breakfast.   Yes [provider]  erythromycin ophthalmic ointment Place 1 application into both eyes 4 (four) times daily. For seven days 05/14/16   Renford DillsMiller, Lindsey, NP  ketotifen (ZADITOR) 0.025 % ophthalmic solution Place  1 drop into the right eye 2 (two) times daily. 08/07/16   Lutricia Feiloemer, William P, PA-C  sulfacetamide (BLEPH-10) 10 % ophthalmic solution Place 2 drops into both eyes 4 (four) times daily. 04/15/17   Payton Mccallumonty, Tatum Massman, MD    Family History Family History  Problem Relation Age of Onset  . Hypertension Mother   . Heart disease Mother   . Kidney disease Father   . Heart disease Father   . Hypertension Father     Social History Social History  Substance Use Topics  . Smoking status: Former Smoker    Quit date: 08/19/2013  . Smokeless tobacco: Never Used  . Alcohol use No     Allergies   Ciprofloxacin; Morphine and related; and Penicillins   Review of Systems Review of Systems  Eyes: Positive for pain.     Physical Exam Triage Vital Signs ED Triage Vitals  Enc Vitals Group     BP 04/15/17 1104 (!) 197/98     Pulse Rate 04/15/17 1104 83     Resp 04/15/17 1104 18     Temp 04/15/17 1104 97.9 F (36.6 C)     Temp Source 04/15/17 1104 Oral     SpO2 04/15/17 1104 100 %     Weight 04/15/17  1103 224 lb (101.6 kg)     Height 04/15/17 1103  (1.626 m)     Head Circumference --      Peak Flow --      Pain Score 04/15/17 1103 8     Pain Loc --      Pain Edu? --      Excl. in GC? --    No data found.   Updated Vital Signs BP (!) 197/98 (BP Location: Left Arm) Comment: Patient did not take BP medication this morning.   Pulse 83   Temp 97.9 F (36.6 C) (Oral)   Resp 18   Ht  (1.626 m)   Wt 224 lb (101.6 kg)   SpO2 100%   BMI 38.45 kg/m   Visual Acuity Right Eye Distance: 20/100 (uncorrected) Left Eye Distance: 20/40 (uncorrected) Bilateral Distance:  (uncorrected)  Right Eye Near:   Left Eye Near:    Bilateral Near:     Physical Exam  Constitutional: She appears well-developed and well-nourished. No distress.  Eyes: Pupils are equal, round, and reactive to light. EOM are normal. Right eye exhibits hordeolum (right upper eyelid). Right conjunctiva is injected.  Left conjunctiva is not injected.  Skin: She is not diaphoretic.  Nursing note and vitals reviewed.    UC Treatments / Results  Labs (all labs ordered are listed, but only abnormal results are displayed) Labs Reviewed - No data to display  EKG  EKG Interpretation None       Radiology No results found.  Procedures Procedures (including critical care time)  Medications Ordered in UC Medications - No data to display   Initial Impression / Assessment and Plan / UC Course  I have reviewed the triage vital signs and the nursing notes.  Pertinent labs & imaging results that were available during my care of the patient were reviewed by me and considered in my medical decision making (see chart for details).      Final Clinical Impressions(s) / UC Diagnoses   Final diagnoses:  Other mucopurulent conjunctivitis of both eyes  Hordeolum externum of right upper eyelid    New Prescriptions Discharge Medication List as of 04/15/2017 12:11 PM    START taking these medications   Details  sulfacetamide (BLEPH-10) 10 % ophthalmic solution Place 2 drops into both eyes 4 (four) times daily., Starting Mon 04/15/2017, Normal       1.diagnosis reviewed with patient 2. rx as per orders above; reviewed possible side effects, interactions, risks and benefits  3. Recommend supportive treatment with warm compresses to area of stye 4. Follow-up prn if symptoms worsen or don't improve  Controlled Substance Prescriptions [place] Controlled Substance Registry consulted? Not Applicable   Payton Mccallum, MD 04/15/17 1240

## 2017-09-24 DIAGNOSIS — F332 Major depressive disorder, recurrent severe without psychotic features: Secondary | ICD-10-CM | POA: Insufficient documentation

## 2017-09-24 DIAGNOSIS — G4700 Insomnia, unspecified: Secondary | ICD-10-CM | POA: Insufficient documentation

## 2017-09-24 DIAGNOSIS — F4321 Adjustment disorder with depressed mood: Secondary | ICD-10-CM | POA: Insufficient documentation

## 2017-11-04 ENCOUNTER — Other Ambulatory Visit: Payer: Self-pay

## 2017-11-04 ENCOUNTER — Encounter: Payer: Self-pay | Admitting: Emergency Medicine

## 2017-11-04 ENCOUNTER — Ambulatory Visit
Admission: EM | Admit: 2017-11-04 | Discharge: 2017-11-04 | Disposition: A | Discharge status: Home / Self Care | Payer: Medicaid Other | Attending: Family Medicine | Admitting: Family Medicine

## 2017-11-04 DIAGNOSIS — H00011 Hordeolum externum right upper eyelid: Secondary | ICD-10-CM

## 2017-11-04 DIAGNOSIS — H00014 Hordeolum externum left upper eyelid: Secondary | ICD-10-CM

## 2017-11-04 DIAGNOSIS — L03213 Periorbital cellulitis: Secondary | ICD-10-CM | POA: Diagnosis not present

## 2017-11-04 DIAGNOSIS — R0602 Shortness of breath: Secondary | ICD-10-CM

## 2017-11-04 MED ORDER — SULFAMETHOXAZOLE-TRIMETHOPRIM 800-160 MG PO TABS: 1.0000 | ORAL_TABLET | Freq: Two times a day (BID) | ORAL | 0 refills | Status: DC

## 2017-11-04 NOTE — ED Triage Notes (Signed)
Patient c/o stye in both eyes for the past week.  Patient reports SOB that stated this morning.

## 2017-11-04 NOTE — ED Provider Notes (Signed)
[facility]    CSN: [id number] Arrival date & time: 11/04/17  1551     History   Chief Complaint Chief Complaint  Patient presents with  . Stye  . Shortness of Breath    HPI Michelle [name] is a 62 y.o. female.   62 yo female with a c/o stye in both upper eyelids for the past week, not improving. Patient states she's been putting warm compresses without improvement and now left upper eyelid swollen and tender.   Also states has allergies and recently has been feeling intermittently short of breath which is relieved with her albuterol inhaler.   The history is provided by the patient.    Past Medical History:  Diagnosis Date  . Asthma   . Depression 2015  . Diabetes type 2, uncontrolled (HCC) 2015  . Hypertension     There are no active problems to display for this patient.   Past Surgical History:  Procedure Laterality Date  . ABDOMINAL HYSTERECTOMY  1998    OB History    Gravida  5   Para  4   Term      Preterm      AB      Living        SAB      TAB      Ectopic      Multiple      Live Births               Home Medications    Prior to Admission medications   Medication Sig Start Date End Date Taking? Authorizing Provider  albuterol (PROVENTIL HFA;VENTOLIN HFA) 108 (90 BASE) MCG/ACT inhaler Inhale 2 puffs into the lungs every 4 (four) hours as needed for wheezing or shortness of breath. 12/08/14  Yes Defelice, Para MarchJeanette, NP  insulin glargine (LANTUS) 100 UNIT/ML injection Inject 75 Units into the skin at bedtime.   Yes [provider]  lisinopril (PRINIVIL,ZESTRIL) 10 MG tablet Take 10 mg by mouth daily.   Yes [provider]  metFORMIN (GLUCOPHAGE) 1000 MG tablet Take 1,000 mg by mouth daily with breakfast.   Yes [provider]  erythromycin ophthalmic ointment Place 1 application into both eyes 4 (four) times daily. For seven days 05/14/16   Renford DillsMiller, Lindsey, NP  fexofenadine (ALLEGRA) 60 MG  tablet Take 1 tablet (60 mg total) by mouth 2 (two) times daily. 08/07/16   Lutricia Feiloemer, William P, PA-C  ketotifen (ZADITOR) 0.025 % ophthalmic solution Place 1 drop into the right eye 2 (two) times daily. 08/07/16   Lutricia Feiloemer, William P, PA-C  sulfacetamide (BLEPH-10) 10 % ophthalmic solution Place 2 drops into both eyes 4 (four) times daily. 04/15/17   Payton Mccallumonty, Florine Sprenkle, MD  sulfamethoxazole-trimethoprim (BACTRIM DS,SEPTRA DS) 800-160 MG tablet Take 1 tablet by mouth 2 (two) times daily. 11/04/17   Payton Mccallumonty, Marlean Mortell, MD    Family History Family History  Problem Relation Age of Onset  . Hypertension Mother   . Heart disease Mother   . Kidney disease Father   . Heart disease Father   . Hypertension Father     Social History Social History   Tobacco Use  . Smoking status: Former Smoker    Last attempt to quit: 08/19/2013    Years since quitting: 4.2  . Smokeless tobacco: Never Used  Substance Use Topics  . Alcohol use: No  . Drug use: No     Allergies   Ciprofloxacin; Morphine and related; and Penicillins   Review  of Systems Review of Systems   Physical Exam Triage Vital Signs ED Triage Vitals  Enc Vitals Group     BP 11/04/17 1617 (!) 157/93     Pulse Rate 11/04/17 1617 98     Resp 11/04/17 1617 16     Temp 11/04/17 1617 98.2 F (36.8 C)     Temp Source 11/04/17 1617 Oral     SpO2 11/04/17 1617 100 %     Weight 11/04/17 1614 210 lb (95.3 kg)     Height 11/04/17 1614 5\' 3"  (1.6 m)     Head Circumference --      Peak Flow --      Pain Score 11/04/17 1614 0     Pain Loc --      Pain Edu? --      Excl. in GC? --    No data found.  Updated Vital Signs BP (!) 157/93 (BP Location: Right Arm)   Pulse 98   Temp 98.2 F (36.8 C) (Oral)   Resp 16   Ht 5\' 3"  (1.6 m)   Wt 210 lb (95.3 kg)   SpO2 100%   BMI 37.20 kg/m   Visual Acuity Right Eye Distance: 20/40 uncorrected Left Eye Distance: 20/30 uncorrected Bilateral Distance: 20/30 uncorrected  Right Eye Near:   Left Eye  Near:    Bilateral Near:     Physical Exam  Constitutional: She appears well-developed and well-nourished. No distress.  Eyes: Pupils are equal, round, and reactive to light. Conjunctivae and EOM are normal. Right eye exhibits hordeolum. Left eye exhibits hordeolum.  Left upper eyelid swollen, erythematous and tender to palpation  Cardiovascular: Normal rate, regular rhythm and normal heart sounds.  Pulmonary/Chest: Effort normal and breath sounds normal. No stridor. No respiratory distress. She has no wheezes. She has no rales.  Skin: She is not diaphoretic.  Nursing note and vitals reviewed.    UC Treatments / Results  Labs (all labs ordered are listed, but only abnormal results are displayed) Labs Reviewed - No data to display  EKG None Radiology No results found.  Procedures Procedures (including critical care time)  Medications Ordered in UC Medications - No data to display   Initial Impression / Assessment and Plan / UC Course  I have reviewed the triage vital signs and the nursing notes.  Pertinent labs & imaging results that were available during my care of the patient were reviewed by me and considered in my medical decision making (see chart for details).      Final Clinical Impressions(s) / UC Diagnoses   Final diagnoses:  Preseptal cellulitis of left eye  Hordeolum externum of right upper eyelid  Hordeolum externum of left upper eyelid  Shortness of breath  (likely secondary to bronchospasm)  ED Discharge Orders        Ordered    sulfamethoxazole-trimethoprim (BACTRIM DS,SEPTRA DS) 800-160 MG tablet  2 times daily     11/04/17 1654     1. diagnosis reviewed with patient 2. rx as per orders above; reviewed possible side effects, interactions, risks and benefits  3. Recommend supportive treatment with warm compresses 4. Follow-up prn if symptoms worsen or don't improve  Controlled Substance Prescriptions [place] Controlled Substance Registry consulted?  Not Applicable   Payton Mccallum, MD 11/04/17 [phone]

## 2018-03-03 IMAGING — CT CT HEAD W/O CM
3 series · 16 of 45 positions shown, 19 images · non-contrast
Comparison: 05/16/2013

CLINICAL DATA: High blood pressure.  Dizziness.

EXAM:
CT HEAD WITHOUT CONTRAST
TECHNIQUE: Contiguous axial images were obtained from the base of the skull
through the vertex without intravenous contrast.

[Series 2: head wo · axial · 0.40mm/px · z∈[-115,+0]mm · 10 of 28 slices shown, 13 images]
[im 3/28  brain]
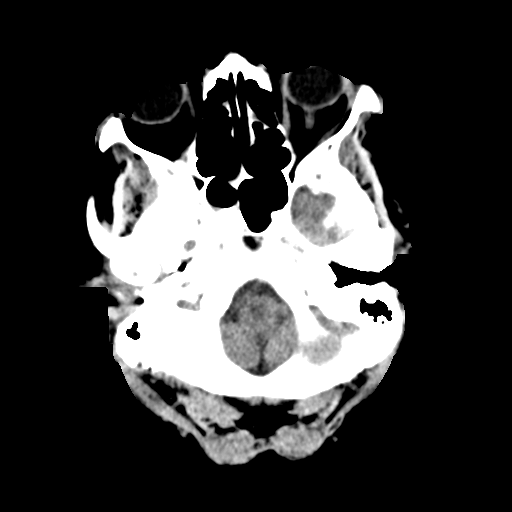
[im 3/28  bone]
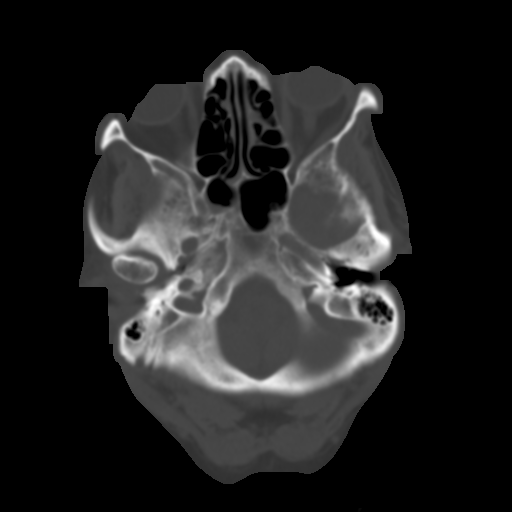
[im 5/28  brain]
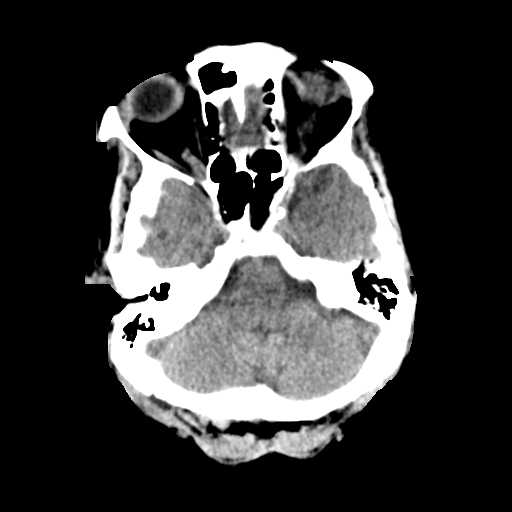
[im 8/28  brain]
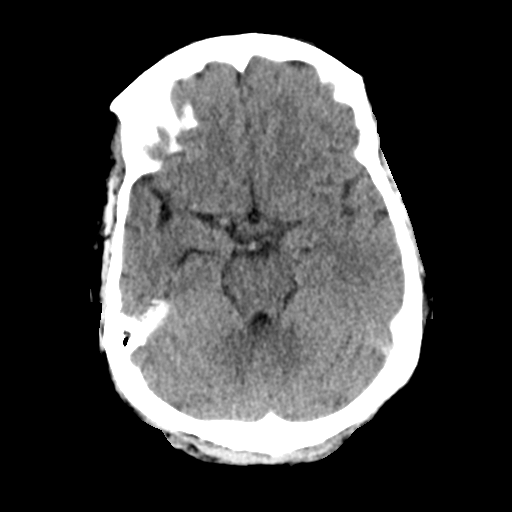
[im 11/28  brain]
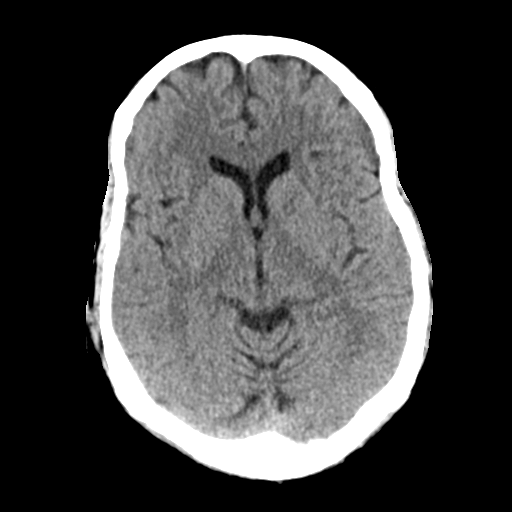
[im 13/28  brain]
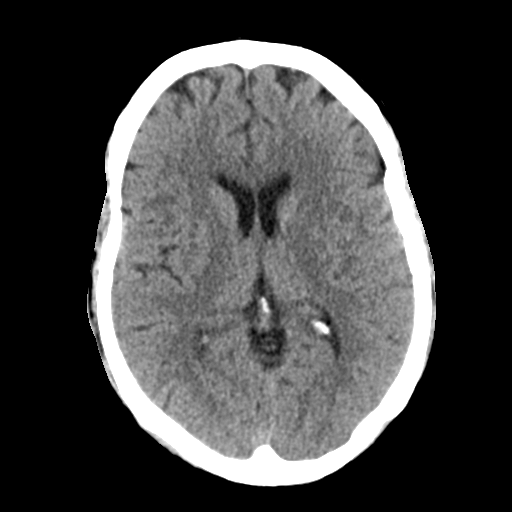
[im 13/28  bone]
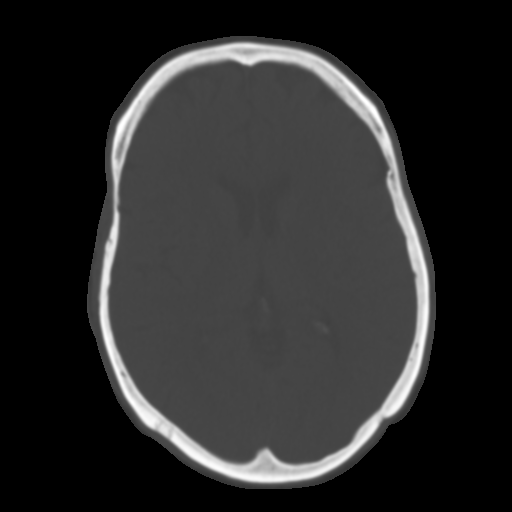
[im 16/28  brain]
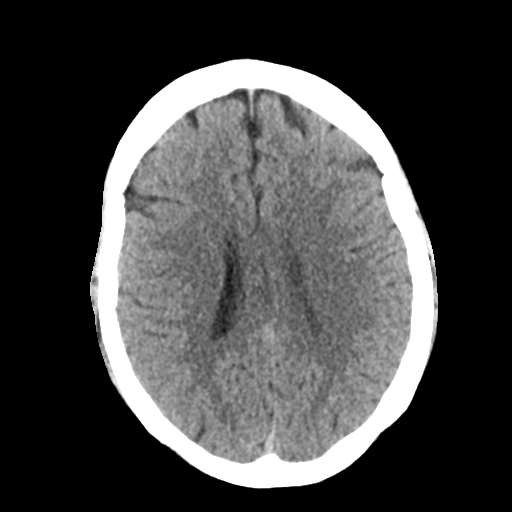
[im 18/28  brain]
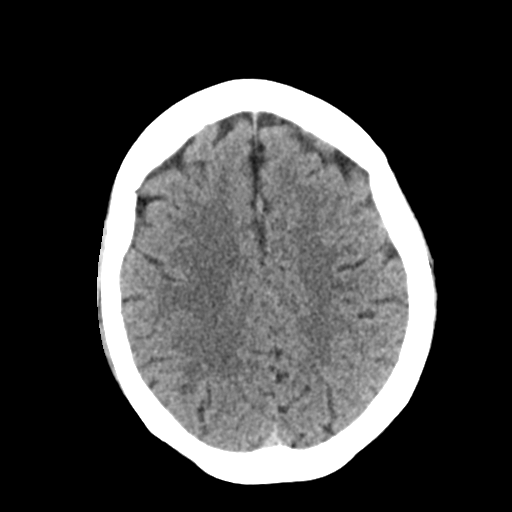
[im 21/28  brain]
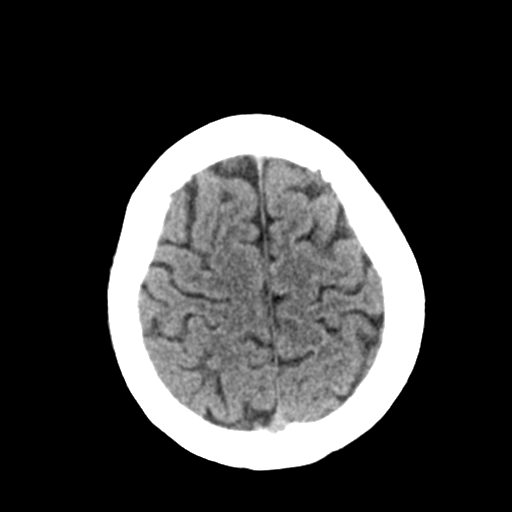
[im 24/28  brain]
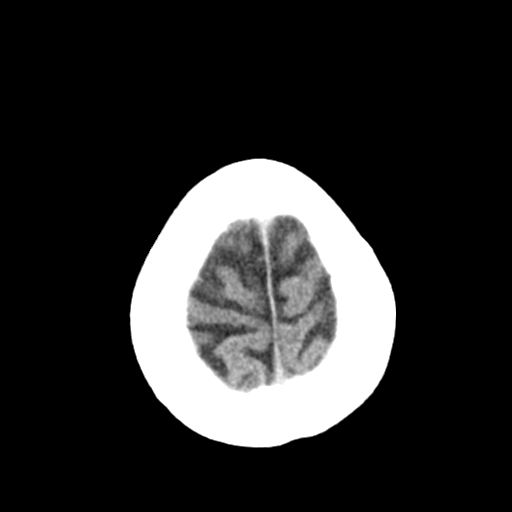
[im 24/28  bone]
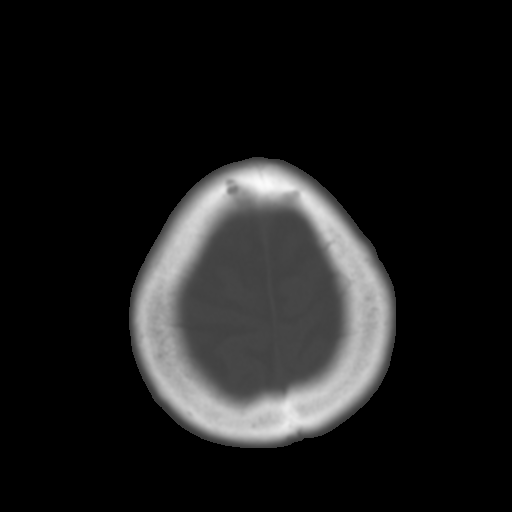
[im 26/28  brain]
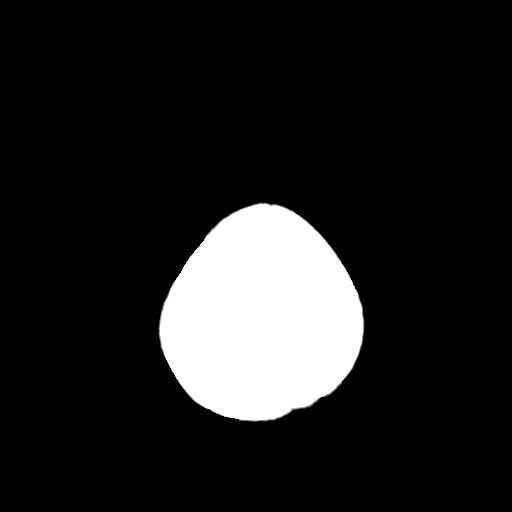

[Series 4: coronal soft tissue · coronal · 0.28mm/px · 3 of 63 slices shown]
[im 21/63  brain]
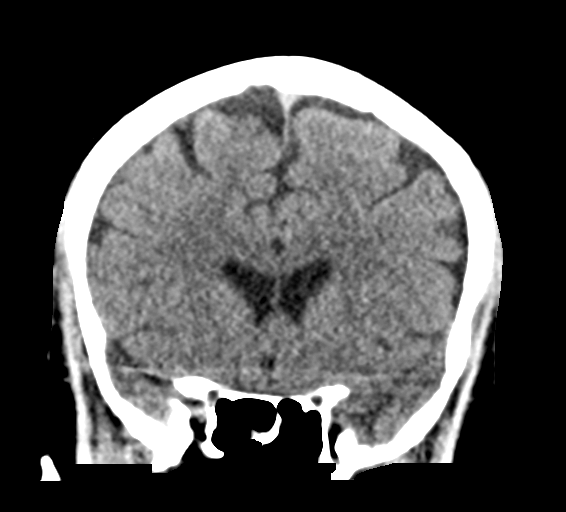
[im 28/63  brain]
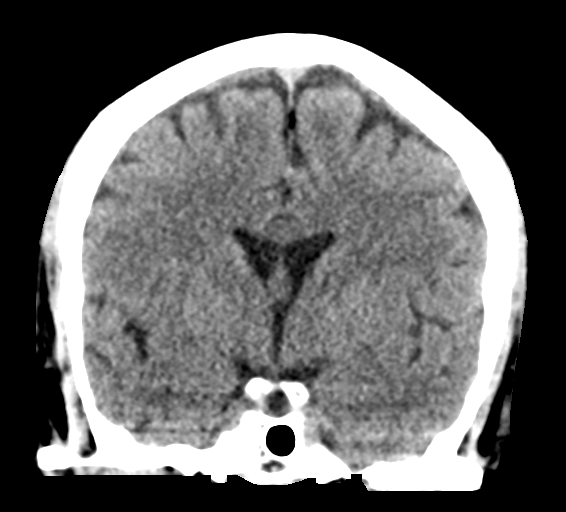
[im 35/63  brain]
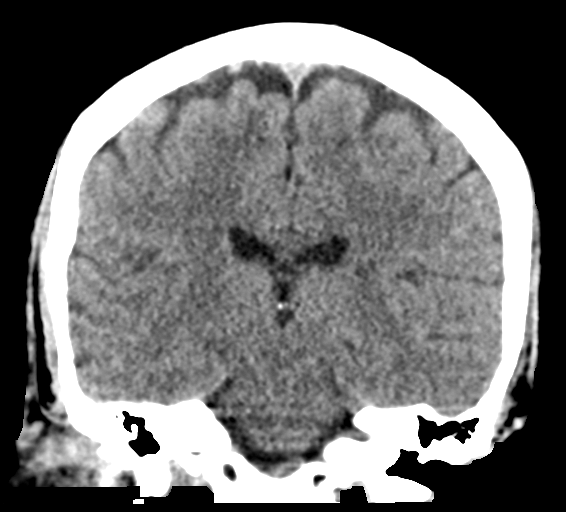

[Series 5: sagittal soft tissue · sagittal · 0.29mm/px · 3 of 51 slices shown]
[im 17/51  brain]
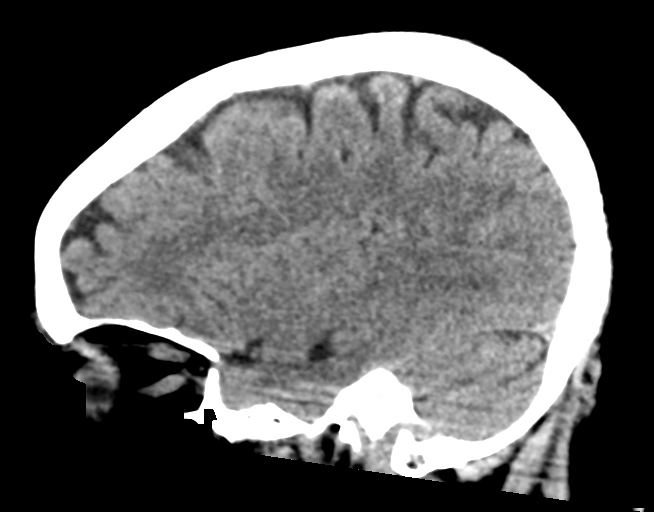
[im 26/51  brain]
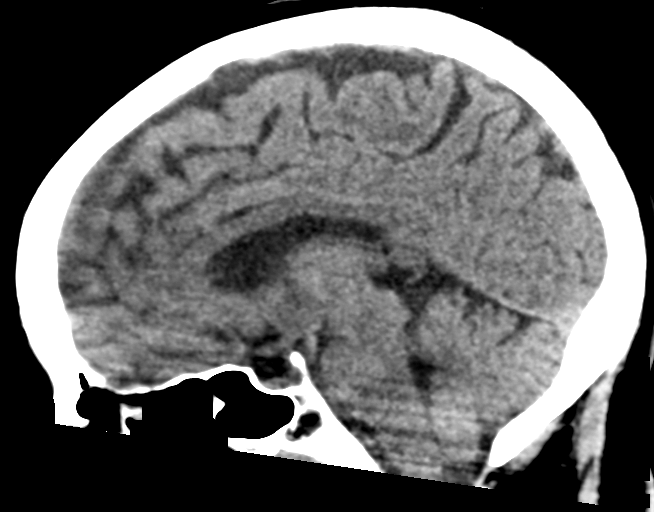
[im 34/51  brain]
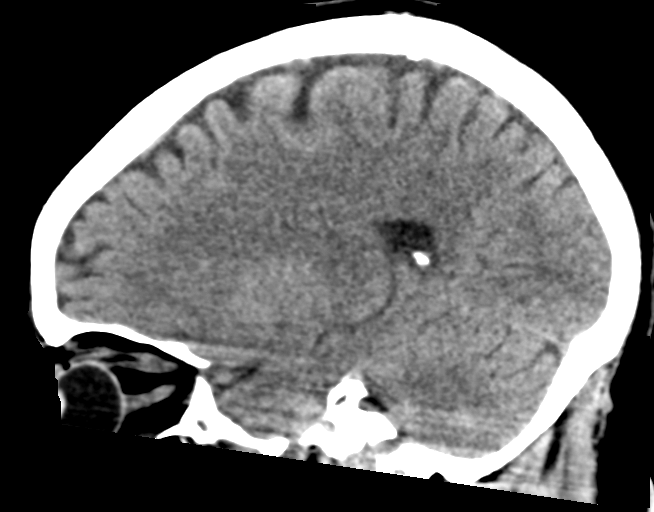

[16 of 45 positions shown; findings below may reference images not displayed]

FINDINGS: Brain: No evidence of acute infarction, hemorrhage, hydrocephalus,
extra-axial collection or mass lesion/mass effect.

Vascular: No hyperdense vessel or unexpected calcification.

Skull: No osseous abnormality.

Sinuses/Orbits: Visualized paranasal sinuses are clear. Visualized
mastoid sinuses are clear. Visualized orbits demonstrate no focal
abnormality.

Other: None
IMPRESSION: 1. No acute intracranial pathology.

## 2018-03-03 IMAGING — CT CT ANGIO HEAD
1 of 8 series · 6 of 33 positions shown · IV contrast (APPLIED)
Comparison: Prior noncontrast head CT from earlier the same day.

CLINICAL DATA: Initial evaluation for acute right-sided facial
numbness.

EXAM:
CT ANGIOGRAPHY HEAD AND NECK
TECHNIQUE: Multidetector CT imaging of the head and neck was performed using
the standard protocol during bolus administration of intravenous
contrast. Multiplanar CT image reconstructions and MIPs were
obtained to evaluate the vascular anatomy. Carotid stenosis
measurements (when applicable) are obtained utilizing NASCET
criteria, using the distal internal carotid diameter as the
denominator.
CONTRAST:  75 cc of Isovue 370.

[Series 6: ax thin · axial · 0.52mm/px · z∈[-174,+70]mm · 6 of 343 slices shown]
[im 49/343  soft-tissue]
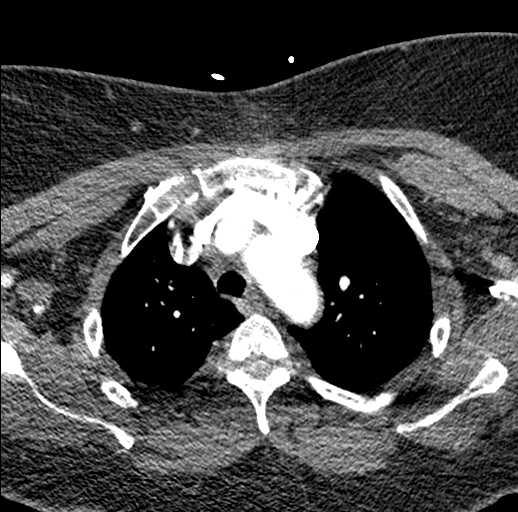
[im 98/343  bone]
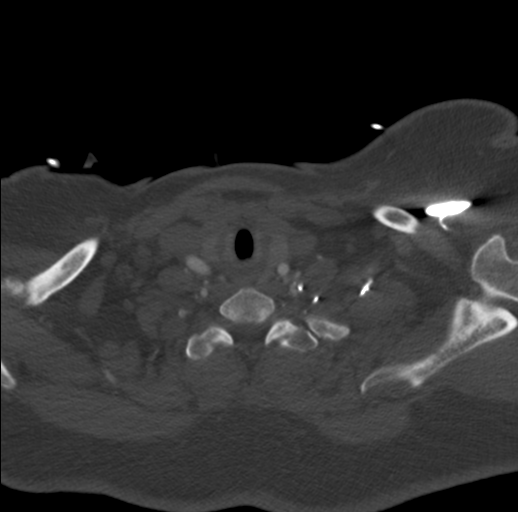
[im 147/343  soft-tissue]
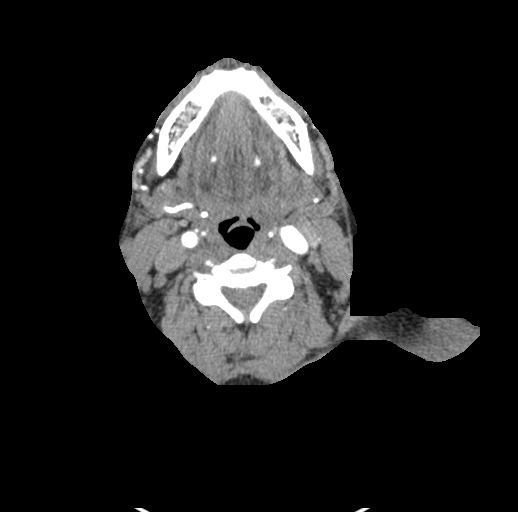
[im 196/343  bone]
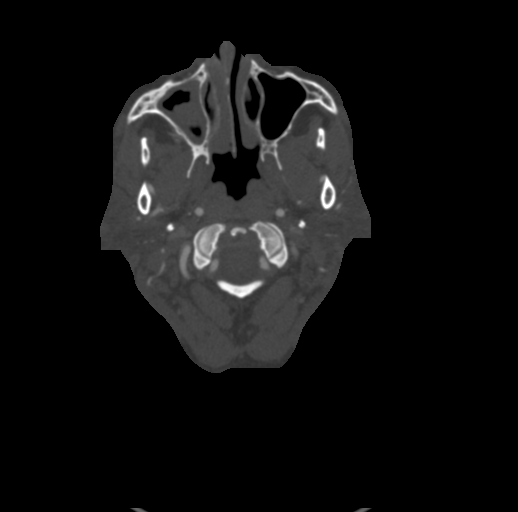
[im 245/343  soft-tissue]
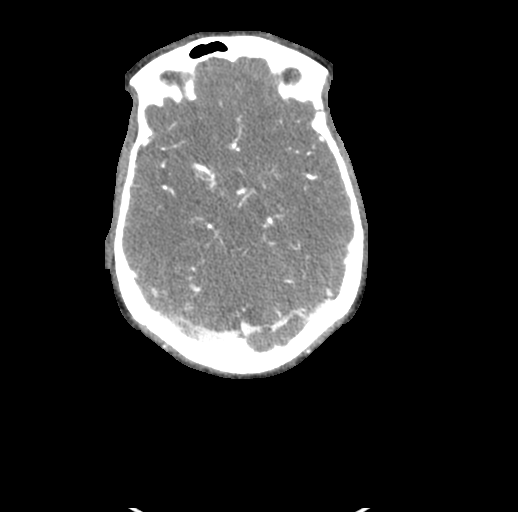
[im 294/343  bone]
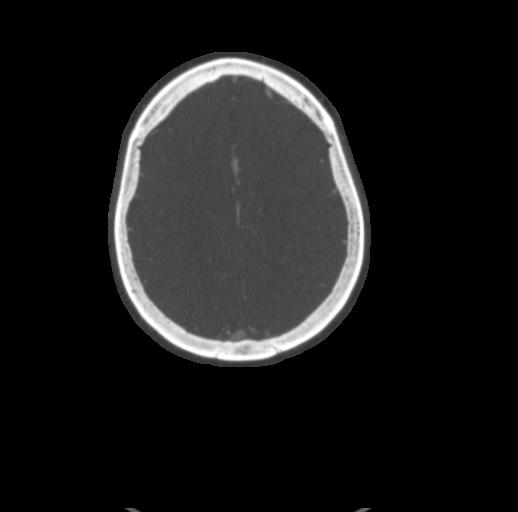

[6 of 33 positions shown; findings below may reference images not displayed]

FINDINGS: CTA NECK FINDINGS

Aortic arch: Visualized aorta is of normal caliber with normal
branch pattern. No high-grade stenosis at the origin of the great
vessels. Visualized subclavian arteries are widely patent and
normal.

Right carotid system: Right common carotid artery widely patent from
the origin to the bifurcation. Right ICA widely patent from knee
bifurcation to the skullbase. No stenosis, dissection, or vascular
occlusion within the right carotid artery system.

Left carotid system: Left common carotid artery patent from its
origin to the bifurcation. Left ICA widely patent from the
bifurcation to the skullbase. No significant stenosis, dissection,
or vascular occlusion within the left carotid artery system. Mild
eccentric atheromatous plaque noted within the proximal left ICA
without stenosis (series 6, image 28).

Vertebral arteries: Both of the vertebral arteries arise from the
subclavian arteries. Vertebral arteries widely patent within the
neck without stenosis, dissection, or occlusion.

Skeleton: No acute osseus abnormality. No worrisome lytic or blastic
osseous lesions.

Other neck: No acute soft tissue abnormality within the neck. No
adenopathy. Salivary gland normal limits. Thyroid normal limits.
Moderate mucosal thickening noted within the right maxillary sinus
with scattered opacity within the ethmoidal air cells and right
sphenoid sinus. Changes are likely chronic.

Upper chest: Visualized mediastinum within normal limits. Visualized
lungs demonstrate no acute process. 6 mm nodule within the
peripheral right upper lobe noted (series 4, image 12).
Indeterminate.

Review of the MIP images confirms the above findings

CTA HEAD FINDINGS

Anterior circulation: Petrous segments widely patent bilaterally.
Mild scattered atheromatous plaque within the cavernous ICAs without
flow-limiting stenosis. And 1 segments patent. Anterior
communicating artery normal. Anterior cerebral arteries well
opacified to their distal aspects. M1 segments patent without
stenosis or occlusion. MCA bifurcations normal. No proximal M2
occlusion. Distal MCA branches well opacified and symmetric.

Posterior circulation: Vertebral arteries widely patent to the
vertebrobasilar junction. Posterior inferior cerebral arteries
patent bilaterally. Basilar artery widely patent to its distal
aspect. Superior cerebellar arteries patent bilaterally. Both of the
posterior cerebral arteries largely supplied via the basilar artery
and are well opacified to their distal aspects.

Venous sinuses: Patent.  The no significant anatomic variant.

Anatomic variants: No aneurysm or vascular malformation.

Delayed phase: No pathologic enhancement.

Review of the MIP images confirms the above findings
IMPRESSION: 1. Negative CTA of the head and neck. No emergent large vessel
occlusion. No high-grade or correctable stenosis.
2. Mild atheromatous plaque within the carotid siphons without flow
limiting stenosis.
3. Widely patent arterial vasculature within the neck. No
significant stenosis identified.
4. **An incidental finding of potential clinical significance has
been found. 6 mm right upper lobe pulmonary nodule, indeterminate.
Non-contrast chest CT at 6-12 months is recommended. If the nodule
is stable at time of repeat CT, then future CT at 18-24 months (from
today's scan) is considered optional for low-risk patients, but is
recommended for high-risk patients. This recommendation follows the
consensus statement: Guidelines for Management of Incidental
Pulmonary Nodules Detected on CT Images: From the [HOSPITAL]

## 2018-04-08 DIAGNOSIS — E1140 Type 2 diabetes mellitus with diabetic neuropathy, unspecified: Secondary | ICD-10-CM | POA: Insufficient documentation

## 2019-07-07 DIAGNOSIS — Z0289 Encounter for other administrative examinations: Secondary | ICD-10-CM | POA: Insufficient documentation

## 2019-08-04 DIAGNOSIS — M1990 Unspecified osteoarthritis, unspecified site: Secondary | ICD-10-CM | POA: Insufficient documentation

## 2020-01-22 ENCOUNTER — Encounter: Payer: Self-pay | Admitting: Emergency Medicine

## 2020-01-22 ENCOUNTER — Ambulatory Visit
Admission: EM | Admit: 2020-01-22 | Discharge: 2020-01-22 | Disposition: A | Discharge status: Home / Self Care | Payer: Medicaid Other | Attending: Physician Assistant | Admitting: Physician Assistant

## 2020-01-22 ENCOUNTER — Other Ambulatory Visit: Payer: Self-pay

## 2020-01-22 ENCOUNTER — Ambulatory Visit (INDEPENDENT_AMBULATORY_CARE_PROVIDER_SITE_OTHER): Payer: Medicaid Other

## 2020-01-22 DIAGNOSIS — E119 Type 2 diabetes mellitus without complications: Secondary | ICD-10-CM | POA: Diagnosis not present

## 2020-01-22 DIAGNOSIS — J209 Acute bronchitis, unspecified: Secondary | ICD-10-CM | POA: Diagnosis not present

## 2020-01-22 DIAGNOSIS — Z20822 Contact with and (suspected) exposure to covid-19: Secondary | ICD-10-CM | POA: Insufficient documentation

## 2020-01-22 DIAGNOSIS — Z841 Family history of disorders of kidney and ureter: Secondary | ICD-10-CM | POA: Insufficient documentation

## 2020-01-22 DIAGNOSIS — Z880 Allergy status to penicillin: Secondary | ICD-10-CM | POA: Diagnosis not present

## 2020-01-22 DIAGNOSIS — J45909 Unspecified asthma, uncomplicated: Secondary | ICD-10-CM | POA: Insufficient documentation

## 2020-01-22 DIAGNOSIS — Z885 Allergy status to narcotic agent status: Secondary | ICD-10-CM | POA: Diagnosis not present

## 2020-01-22 DIAGNOSIS — Z8249 Family history of ischemic heart disease and other diseases of the circulatory system: Secondary | ICD-10-CM | POA: Insufficient documentation

## 2020-01-22 DIAGNOSIS — Z7951 Long term (current) use of inhaled steroids: Secondary | ICD-10-CM | POA: Diagnosis not present

## 2020-01-22 DIAGNOSIS — Z7982 Long term (current) use of aspirin: Secondary | ICD-10-CM | POA: Insufficient documentation

## 2020-01-22 DIAGNOSIS — Z87891 Personal history of nicotine dependence: Secondary | ICD-10-CM | POA: Insufficient documentation

## 2020-01-22 DIAGNOSIS — I10 Essential (primary) hypertension: Secondary | ICD-10-CM | POA: Diagnosis not present

## 2020-01-22 DIAGNOSIS — Z79899 Other long term (current) drug therapy: Secondary | ICD-10-CM | POA: Diagnosis not present

## 2020-01-22 DIAGNOSIS — F329 Major depressive disorder, single episode, unspecified: Secondary | ICD-10-CM | POA: Diagnosis not present

## 2020-01-22 DIAGNOSIS — Z794 Long term (current) use of insulin: Secondary | ICD-10-CM | POA: Diagnosis not present

## 2020-01-22 DIAGNOSIS — Z881 Allergy status to other antibiotic agents status: Secondary | ICD-10-CM | POA: Diagnosis not present

## 2020-01-22 DIAGNOSIS — R05 Cough: Secondary | ICD-10-CM | POA: Diagnosis present

## 2020-01-22 MED ORDER — BUDESONIDE-FORMOTEROL FUMARATE 160-4.5 MCG/ACT IN AERO: 2.0000 | INHALATION_SPRAY | RESPIRATORY_TRACT | 0 refills | Status: AC

## 2020-01-22 MED ORDER — DOXYCYCLINE HYCLATE 100 MG PO TABS: 100.0000 mg | ORAL_TABLET | Freq: Two times a day (BID) | ORAL | 0 refills | Status: DC

## 2020-01-22 NOTE — Discharge Instructions (Signed)
Return if any problems.

## 2020-01-22 NOTE — ED Triage Notes (Signed)
Patient c/o cough, runny nose, congestion, chills and fever that started on Monday.

## 2020-01-23 LAB — SARS CORONAVIRUS 2 (TAT 6-24 HRS): SARS Coronavirus 2: NEGATIVE

## 2020-01-24 NOTE — ED Provider Notes (Signed)
[facility]    CSN: [id number] Arrival date & time: 01/22/20  1631      History   Chief Complaint Chief Complaint  Patient presents with  . Cough  . Fever    HPI Michelle [name] is a 65 y.o. female.   The history is provided by the patient. No language interpreter was used.  Cough Cough characteristics:  Productive Sputum characteristics:  Nondescript Severity:  Moderate Onset quality:  Gradual Timing:  Constant Progression:  Worsening Chronicity:  New Relieved by:  Nothing Ineffective treatments:  None tried Associated symptoms: fever   Associated symptoms: no rhinorrhea   Fever Associated symptoms: cough   Associated symptoms: no rhinorrhea     Past Medical History:  Diagnosis Date  . Asthma   . Depression 2015  . Diabetes type 2, uncontrolled (HCC) 2015  . Hypertension     There are no problems to display for this patient.   Past Surgical History:  Procedure Laterality Date  . ABDOMINAL HYSTERECTOMY  1998    OB History    Gravida  5   Para  4   Term      Preterm      AB      Living        SAB      TAB      Ectopic      Multiple      Live Births               Home Medications    Prior to Admission medications   Medication Sig Start Date End Date Taking? Authorizing Provider  albuterol (PROVENTIL HFA;VENTOLIN HFA) 108 (90 BASE) MCG/ACT inhaler Inhale 2 puffs into the lungs every 4 (four) hours as needed for wheezing or shortness of breath. 12/08/14  Yes Defelice, Para March, NP  amLODipine (NORVASC) 10 MG tablet Take by mouth. 10/21/18  Yes [provider]  aspirin 81 MG EC tablet Take by mouth. 10/21/18  Yes [provider]  atorvastatin (LIPITOR) 40 MG tablet Take by mouth. 10/21/18  Yes [provider]  carvedilol (COREG) 25 MG tablet Take by mouth. 02/20/19 02/20/20 Yes [provider]  cetirizine (ZYRTEC) 10 MG tablet Take by mouth. 07/27/19  Yes [provider]    chlorthalidone (HYGROTON) 25 MG tablet Take by mouth. 10/19/19  Yes [provider]  dapagliflozin propanediol (FARXIGA) 5 MG TABS tablet Take by mouth. 10/21/18  Yes [provider]  DULoxetine (CYMBALTA) 60 MG capsule Take by mouth. 08/04/19  Yes [provider]  hydrALAZINE (APRESOLINE) 50 MG tablet Take by mouth. 10/27/19 10/26/20 Yes [provider]  insulin glargine (LANTUS) 100 UNIT/ML injection Inject 75 Units into the skin at bedtime.   Yes [provider]  liraglutide (VICTOZA) 18 MG/3ML SOPN Inject into the skin. 10/19/19 10/18/20 Yes [provider]  losartan (COZAAR) 100 MG tablet Take by mouth. 10/21/18  Yes [provider]  pregabalin (LYRICA) 100 MG capsule Take by mouth. 10/27/19  Yes [provider]  traZODone (DESYREL) 50 MG tablet Take 1-3 tablets each night AS NEEDED for sleep. 03/19/19  Yes [provider]  ziprasidone (GEODON) 60 MG capsule Take by mouth. 10/19/19  Yes [provider]  budesonide-formoterol (SYMBICORT) 160-4.5 MCG/ACT inhaler Inhale 2 puffs into the lungs 1 day or 1 dose. 01/22/20   Elson Areas, PA-C  doxycycline (VIBRA-TABS) 100 MG tablet Take 1 tablet (100 mg total) by mouth 2 (two) times daily. 01/22/20  Cheron Schaumann K, PA-C  erythromycin ophthalmic ointment Place 1 application into both eyes 4 (four) times daily. For seven days 05/14/16   Renford Dills, NP  fexofenadine (ALLEGRA) 60 MG tablet Take 1 tablet (60 mg total) by mouth 2 (two) times daily. 08/07/16 01/22/20  Lutricia Feil, PA-C  lisinopril (PRINIVIL,ZESTRIL) 10 MG tablet Take 10 mg by mouth daily.  01/22/20  [provider]  metFORMIN (GLUCOPHAGE) 1000 MG tablet Take 1,000 mg by mouth daily with breakfast.  01/22/20  [provider]    Family History Family History  Problem Relation Age of Onset  . Hypertension Mother   . Heart disease Mother   . Kidney disease Father   . Heart disease  Father   . Hypertension Father     Social History Social History   Tobacco Use  . Smoking status: Former Smoker    Quit date: 08/19/2013    Years since quitting: 6.4  . Smokeless tobacco: Never Used  Vaping Use  . Vaping Use: Never used  Substance Use Topics  . Alcohol use: No  . Drug use: No     Allergies   Ciprofloxacin, Morphine and related, and Penicillins   Review of Systems Review of Systems  Constitutional: Positive for fever.  HENT: Negative for rhinorrhea.   Respiratory: Positive for cough.   All other systems reviewed and are negative.    Physical Exam Triage Vital Signs ED Triage Vitals  Enc Vitals Group     BP 01/22/20 1720 (!) 179/91     Pulse Rate 01/22/20 1720 83     Resp 01/22/20 1720 16     Temp 01/22/20 1720 98.1 F (36.7 C)     Temp Source 01/22/20 1720 Oral     SpO2 01/22/20 1720 96 %     Weight 01/22/20 1715 222 lb (100.7 kg)     Height 01/22/20 1715 5\' 4"  (1.626 m)     Head Circumference --      Peak Flow --      Pain Score 01/22/20 1714 8     Pain Loc --      Pain Edu? --      Excl. in GC? --    No data found.  Updated Vital Signs BP (!) 179/91 (BP Location: Left Arm) Comment: Patient states that she has not taken her BP medicines today.  Pulse 83   Temp 98.1 F (36.7 C) (Oral)   Resp 16   Ht 5\' 4"  (1.626 m)   Wt 100.7 kg   SpO2 96%   BMI 38.11 kg/m   Visual Acuity Right Eye Distance:   Left Eye Distance:   Bilateral Distance:    Right Eye Near:   Left Eye Near:    Bilateral Near:     Physical Exam Vitals and nursing note reviewed.  Constitutional:      Appearance: She is well-developed.  HENT:     Head: Normocephalic.  Cardiovascular:     Rate and Rhythm: Normal rate and regular rhythm.  Pulmonary:     Effort: Pulmonary effort is normal.  Abdominal:     General: There is no distension.  Musculoskeletal:        General: Normal range of motion.     Cervical back: Normal range of motion.  Skin:    General:  Skin is warm.  Neurological:     General: No focal deficit present.     Mental Status: She is alert and oriented to person, place,  and time.  Psychiatric:        Mood and Affect: Mood normal.      UC Treatments / Results  Labs (all labs ordered are listed, but only abnormal results are displayed) Labs Reviewed  SARS CORONAVIRUS 2 (TAT 6-24 HRS)    EKG   Radiology No results found.  Procedures Procedures (including critical care time)  Medications Ordered in UC Medications - No data to display  Initial Impression / Assessment and Plan / UC Course  I have reviewed the triage vital signs and the nursing notes.  Pertinent labs & imaging results that were available during my care of the patient were reviewed by me and considered in my medical decision making (see chart for details).     MDM: Pt given rx for doxycycline  Pt's given rx for synbicort Final Clinical Impressions(s) / UC Diagnoses   Final diagnoses:  Acute bronchitis, unspecified organism     Discharge Instructions     Return if any problems.    ED Prescriptions    Medication Sig Dispense Auth. Provider   doxycycline (VIBRA-TABS) 100 MG tablet Take 1 tablet (100 mg total) by mouth 2 (two) times daily. 20 tablet Levita Monical K, [place]   budesonide-formoterol [facility]) 160-4.5 MCG/ACT inhaler Inhale 2 puffs into the lungs 1 day or 1 dose. Mora, Raimundo Corbit K, [place]     PDMP not reviewed this encounter.  An After Visit Summary was printed and given to the patient. An After Visit Summary was printed and given to the patient.     Fransico Meadow, [place] 01/24/20 1849

## 2020-04-12 ENCOUNTER — Other Ambulatory Visit: Payer: Self-pay

## 2020-04-12 ENCOUNTER — Ambulatory Visit (LOCAL_COMMUNITY_HEALTH_CENTER): Payer: Self-pay

## 2020-04-12 DIAGNOSIS — Z111 Encounter for screening for respiratory tuberculosis: Secondary | ICD-10-CM

## 2020-04-14 DIAGNOSIS — G4733 Obstructive sleep apnea (adult) (pediatric): Secondary | ICD-10-CM | POA: Insufficient documentation

## 2020-04-15 ENCOUNTER — Other Ambulatory Visit: Payer: Medicaid Other

## 2020-04-15 ENCOUNTER — Other Ambulatory Visit: Payer: Self-pay

## 2020-04-15 ENCOUNTER — Ambulatory Visit (LOCAL_COMMUNITY_HEALTH_CENTER): Payer: Medicaid Other

## 2020-04-15 DIAGNOSIS — Z111 Encounter for screening for respiratory tuberculosis: Secondary | ICD-10-CM

## 2020-04-15 LAB — TB SKIN TEST
Induration: 0 mm
TB Skin Test: NEGATIVE

## 2020-10-21 DIAGNOSIS — H2511 Age-related nuclear cataract, right eye: Secondary | ICD-10-CM | POA: Insufficient documentation

## 2021-01-03 DIAGNOSIS — Z91148 Patient's other noncompliance with medication regimen for other reason: Secondary | ICD-10-CM | POA: Insufficient documentation

## 2021-01-03 DIAGNOSIS — Z9114 Patient's other noncompliance with medication regimen: Secondary | ICD-10-CM | POA: Insufficient documentation

## 2021-07-11 DIAGNOSIS — B370 Candidal stomatitis: Secondary | ICD-10-CM | POA: Insufficient documentation

## 2021-10-12 ENCOUNTER — Other Ambulatory Visit: Payer: Self-pay

## 2021-10-12 ENCOUNTER — Ambulatory Visit (INDEPENDENT_AMBULATORY_CARE_PROVIDER_SITE_OTHER): Payer: Medicare Other | Admitting: Podiatry

## 2021-10-12 ENCOUNTER — Encounter: Payer: Self-pay | Admitting: Podiatry

## 2021-10-12 DIAGNOSIS — E118 Type 2 diabetes mellitus with unspecified complications: Secondary | ICD-10-CM

## 2021-10-12 DIAGNOSIS — E119 Type 2 diabetes mellitus without complications: Secondary | ICD-10-CM

## 2021-10-12 DIAGNOSIS — Z794 Long term (current) use of insulin: Secondary | ICD-10-CM

## 2021-10-12 NOTE — Progress Notes (Signed)
Subjective: Michelle [name] presents today referred by [facility] for diabetic foot evaluation.  Patient relates 2013 year history of diabetes.  Patient denies any history of foot wounds.  Patient has  history of numbness, tingling, burning, pins/needles sensations.  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   Mastodynia 12/26/2016   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/23/2015   Recurrent major depressive disorder, in partial remission (HCC) 07/19/2015   Mild intermittent asthma without complication 01/08/2014   Depression 11/23/2013   Essential hypertension 10/08/2013   Hyperlipidemia 12/25/2011   Type 2 diabetes mellitus with complication, with long-term current use of insulin (HCC) 12/25/2011    Past Surgical History:  Procedure Laterality Date   ABDOMINAL HYSTERECTOMY  1998    Current Outpatient Medications on File Prior to Visit  Medication Sig Dispense Refill   amLODipine (NORVASC) 10 MG tablet Take 1 tablet by mouth daily.     aspirin 81 MG EC tablet Take 1 tablet by mouth daily.     atorvastatin (LIPITOR) 80 MG tablet Take 1 tablet by mouth daily.     cetirizine (ZYRTEC) 10 MG tablet Take 1 tablet by mouth daily.     diclofenac Sodium (VOLTAREN) 1 % GEL Apply topically.     DULoxetine (CYMBALTA) 60 MG capsule Take by mouth.     fluticasone (FLONASE) 50 MCG/ACT nasal spray Place into  the nose.     hydrALAZINE (APRESOLINE) 50 MG tablet Take by mouth.     hydrOXYzine (ATARAX) 50 MG tablet Take by mouth.     insulin aspart (NOVOLOG) 100 UNIT/ML injection 5u TID AC     losartan (COZAAR) 100 MG tablet Take 1 tablet by mouth daily.     metFORMIN (GLUCOPHAGE) 500 MG tablet Take by mouth.     spironolactone (ALDACTONE) 25 MG tablet Take 1 tablet by mouth daily.     albuterol (PROVENTIL HFA;VENTOLIN HFA) 108 (90 BASE) MCG/ACT inhaler Inhale 2 puffs into the lungs every 4 (four) hours as needed for wheezing or shortness of breath. 1 Inhaler 0   atorvastatin (LIPITOR) 40 MG tablet Take by mouth.     budesonide-formoterol (SYMBICORT) 160-4.5 MCG/ACT inhaler Inhale 2 puffs into the lungs 1 day or 1 dose. 1 Inhaler 0   carvedilol (COREG) 25 MG tablet Take by mouth.     celecoxib (CELEBREX) 100 MG capsule Take 100 mg by mouth 2 (two) times daily.     chlorthalidone (HYGROTON) 25 MG tablet Take by mouth.     dapagliflozin propanediol (FARXIGA) 5 MG TABS tablet Take by mouth.     erythromycin ophthalmic ointment Place 1 application into both eyes 4 (four) times daily. For seven days 3.5 g 0   gabapentin (NEURONTIN) 300 MG capsule Take 300 mg by mouth 3 (three) times daily.     insulin glargine (LANTUS) 100 UNIT/ML injection Inject 75 Units into the skin at bedtime.     liraglutide (VICTOZA) 18 MG/3ML SOPN  Inject into the skin.     MOUNJARO 5 MG/0.5ML Pen Inject into the skin.     pantoprazole (PROTONIX) 40 MG tablet Take 40 mg by mouth daily.     pregabalin (LYRICA) 100 MG capsule Take by mouth.     traZODone (DESYREL) 50 MG tablet Take 1-3 tablets each night AS NEEDED for sleep.     TRESIBA FLEXTOUCH 200 UNIT/ML FlexTouch Pen Inject into the skin.     ziprasidone (GEODON) 60 MG capsule Take by mouth.     [DISCONTINUED] fexofenadine (ALLEGRA) 60 MG tablet Take 1 tablet (60 mg total) by mouth 2 (two) times daily. 15 tablet 0   [DISCONTINUED] lisinopril (PRINIVIL,ZESTRIL) 10 MG tablet Take  10 mg by mouth daily.     No current facility-administered medications on file prior to visit.     Allergies  Allergen Reactions   Ace Inhibitors Other (See Comments)    Other reaction(s): Unknown Itchy eyes mouth and skin, possible throat swelling     Ciprofloxacin Itching   Morphine And Related Hives   Penicillins Hives    Social History   Occupational History   Not on file  Tobacco Use   Smoking status: Former    Types: Cigarettes    Quit date: 08/19/2013    Years since quitting: 8.1   Smokeless tobacco: Never  Vaping Use   Vaping Use: Never used  Substance and Sexual Activity   Alcohol use: No   Drug use: No   Sexual activity: Yes    Family History  Problem Relation Age of Onset   Hypertension Mother    Heart disease Mother    Kidney disease Father    Heart disease Father    Hypertension Father     Immunization History  Administered Date(s) Administered   Influenza, Quadrivalent, Recombinant, Inj, Pf 06/01/2019   Influenza, Seasonal, Injecte, Preservative Fre 07/23/2013   Influenza,inj,Quad PF,6+ Mos 04/13/2014, 06/19/2017, 05/22/2018, 07/14/2020   Influenza-Unspecified 05/25/2015, 06/20/2021   PFIZER(Purple Top)SARS-COV-2 Vaccination 11/03/2019, 11/24/2019, 09/14/2020, 01/03/2021   PPD Test 04/12/2020   Pneumococcal Polysaccharide-23 07/23/2013, 09/14/2020   Tdap 12/25/2011   Zoster Recombinat (Shingrix) 07/14/2020, 09/28/2020    Review of systems: Positive Findings in bold print.  Constitutional:  chills, fatigue, fever, sweats, weight change Communication: Nurse, learning disabilitytranslator, sign Presenter, broadcastinglanguage translator, hand writing, iPad/Android device Head: headaches, head injury Eyes: changes in vision, eye pain, glaucoma, cataracts, macular degeneration, diplopia, glare,  light sensitivity, eyeglasses or contacts, blindness Ears nose mouth throat: hearing impaired, hearing aids,  ringing in ears, deaf, sign language,  vertigo, nosebleeds,  rhinitis,  cold sores, snoring,  swollen glands Cardiovascular: HTN, edema, arrhythmia, pacemaker in place, defibrillator in place, chest pain/tightness, chronic anticoagulation, blood clot, heart failure, MI Peripheral Vascular: leg cramps, varicose veins, blood clots, lymphedema, varicosities Respiratory:  asthma, difficulty breathing, denies congestion, SOB, wheezing, cough, emphysema Gastrointestinal: change in appetite or weight, abdominal pain, constipation, diarrhea, nausea, vomiting, vomiting blood, change in bowel habits, abdominal pain, jaundice, rectal bleeding, hemorrhoids, GERD Genitourinary:  nocturia,  pain on urination, polyuria,  blood in urine, Foley catheter, urinary urgency, ESRD on hemodialysis Musculoskeletal: amputation, cramping, stiff joints, painful joints, decreased joint motion, fractures, OA, gout, hemiplegia, paraplegia, uses cane, wheelchair bound, uses walker, uses rollator Skin: +changes in toenails, color change, dryness, itching, mole changes,  rash, wound(s) Neurological: headaches, numbness in feet, paresthesias in feet, burning in feet, fainting,  seizures, change in speech, migraines, memory problems/poor historian, cerebral palsy, weakness, paralysis, CVA, TIA Endocrine: diabetes, hypothyroidism, hyperthyroidism,  goiter,  dry mouth, flushing, heat intolerance, cold intolerance,  excessive thirst, denies polyuria,  nocturia Hematological:  easy bleeding, excessive bleeding, easy bruising, enlarged lymph nodes, on long term blood thinner, history of past transusions Allergy/immunological:  hives, eczema, frequent infections, multiple drug allergies, seasonal allergies, transplant recipient, multiple food allergies Psychiatric:  anxiety, depression, mood disorder, suicidal ideations, hallucinations, insomnia  Objective: There were no vitals filed for this visit. Vascular Examination: Capillary refill time less than 3 seconds x 10 digits.  Dorsalis pedis pulses palpable 2 out of 4.  Posterior  tibial pulses palpable 2 out of 4.  Digital hair not present x 10 digits.  Skin temperature gradient WNL b/l.  Dermatological Examination: Skin with normal turgor, texture and tone b/l  Toenails 1-5 b/l discolored, thick, dystrophic with subungual debris and pain with palpation to nailbeds due to thickness of nails.  Musculoskeletal: Muscle strength 5/5 to all LE muscle groups.  Neurological: Loss of protective sensation to the all digits x10 as well as up to the forefoot With 10 gram monofilament.  Vibratory sensation diminished  Assessment: NIDDM Encounter for diabetic foot examination  Plan: Discussed diabetic foot care principles. Literature dispensed on today. Patient to continue soft, supportive shoe gear daily. Patient to report any pedal injuries to medical professional immediately. Follow up 6 months due to loss of protective sensation.  I have asked her if she develops any ulceration to come see me immediately.  She states understanding. Patient/POA to call should there be a concern in the interim.

## 2021-10-16 ENCOUNTER — Other Ambulatory Visit: Payer: Self-pay

## 2021-10-16 ENCOUNTER — Ambulatory Visit
Admission: EM | Admit: 2021-10-16 | Discharge: 2021-10-16 | Disposition: A | Discharge status: Home / Self Care | Payer: Medicare Other | Attending: Physician Assistant | Admitting: Physician Assistant

## 2021-10-16 DIAGNOSIS — N3000 Acute cystitis without hematuria: Secondary | ICD-10-CM | POA: Diagnosis present

## 2021-10-16 LAB — URINALYSIS, ROUTINE W REFLEX MICROSCOPIC
Bilirubin Urine: NEGATIVE
Glucose, UA: NEGATIVE mg/dL
Ketones, ur: NEGATIVE mg/dL
Nitrite: NEGATIVE
Protein, ur: 30 mg/dL — AB
Specific Gravity, Urine: 1.025 (ref 1.005–1.030)
pH: 5.5 (ref 5.0–8.0)

## 2021-10-16 LAB — URINALYSIS, MICROSCOPIC (REFLEX): WBC, UA: >50 WBC/hpf (ref 0–5)

## 2021-10-16 MED ORDER — FLUCONAZOLE 150 MG PO TABS: 150.0000 mg | ORAL_TABLET | Freq: Every day | ORAL | 0 refills | Status: AC

## 2021-10-16 MED ORDER — CEPHALEXIN 500 MG PO CAPS: 500.0000 mg | ORAL_CAPSULE | Freq: Two times a day (BID) | ORAL | 0 refills | Status: AC

## 2021-10-16 NOTE — ED Provider Notes (Signed)
[facility]    CSN: [id number] Arrival date & time: 10/16/21  1314      History   Chief Complaint No chief complaint on file.   HPI Michelle [name] is a 66 y.o. female.   Patient presents with dysuria, urinary frequency, lower abdominal pressure, and urinary odor for 1 week.  Denies urinary urgency, hematuria, flank pain, fever, chills, vaginal discharge, itching, odor.  Has not attempted treatment of symptoms.  Endorses that she was recently treated for urinary infection 2 weeks ago and does not feel that the symptoms fully resolved.  History of type 2 diabetes, hypertension, asthma.    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   Mastodynia 12/26/2016   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/23/2015   Recurrent major depressive disorder, in partial remission (HCC) 07/19/2015   Mild intermittent asthma without complication 01/08/2014   Depression 11/23/2013   Essential hypertension 10/08/2013   Hyperlipidemia 12/25/2011   Type 2 diabetes mellitus with complication, with long-term current use of insulin (HCC) 12/25/2011    Past Surgical History:  Procedure Laterality Date   ABDOMINAL HYSTERECTOMY  1998    OB History     Gravida  5   Para  4   Term      Preterm      AB      Living         SAB      IAB      Ectopic      Multiple      Live Births               Home  Medications    Prior to Admission medications   Medication Sig Start Date End Date Taking? Authorizing Provider  albuterol (PROVENTIL HFA;VENTOLIN HFA) 108 (90 BASE) MCG/ACT inhaler Inhale 2 puffs into the lungs every 4 (four) hours as needed for wheezing or shortness of breath. 12/08/14   Defelice, Para March, NP  amLODipine (NORVASC) 10 MG tablet Take 1 tablet by mouth daily. 10/27/20 10/27/21  [provider]  aspirin 81 MG EC tablet Take 1 tablet by mouth daily. 02/29/20   [provider]  atorvastatin (LIPITOR) 40 MG tablet Take by mouth. 10/21/18   [provider]  atorvastatin (LIPITOR) 80 MG tablet Take 1 tablet by mouth daily. 10/27/20   [provider]  budesonide-formoterol (SYMBICORT) 160-4.5 MCG/ACT inhaler Inhale 2 puffs into the lungs 1 day or 1 dose. 01/22/20   Elson Areas, PA-C  carvedilol (COREG) 25 MG tablet Take by mouth. 02/20/19 02/20/20  [provider]  celecoxib (CELEBREX) 100 MG capsule Take 100 mg by mouth 2 (two) times daily. 08/14/21   [provider]  cetirizine (ZYRTEC) 10 MG tablet Take 1 tablet by mouth daily. 11/30/20 11/30/21  [provider]  chlorthalidone (HYGROTON) 25 MG tablet Take by mouth. 10/19/19   [provider]  dapagliflozin propanediol (FARXIGA) 5 MG TABS tablet Take  by mouth. 10/21/18   [provider]  diclofenac Sodium (VOLTAREN) 1 % GEL Apply topically. 01/03/21 01/03/22  [provider]  DULoxetine (CYMBALTA) 60 MG capsule Take by mouth. 09/17/21   [provider]  erythromycin ophthalmic ointment Place 1 application into both eyes 4 (four) times daily. For seven days 05/14/16   Renford DillsMiller, Lindsey, NP  fluticasone [facility](FLONASE) 50 MCG/ACT nasal spray Place into the nose. 05/08/20   [provider]  gabapentin (NEURONTIN) 300 MG capsule Take 300 mg by mouth 3 (three) times daily. 08/14/21   [provider]  hydrALAZINE (APRESOLINE) 50 MG tablet Take by  mouth. 10/27/19   [provider]  hydrOXYzine (ATARAX) 50 MG tablet Take by mouth. 02/19/21   [provider]  insulin aspart (NOVOLOG) 100 UNIT/[facility] 03/31/20   [provider]  insulin glargine (LANTUS) 100 UNIT/ML injection Inject 75 Units into the skin at bedtime.    [provider]  liraglutide (VICTOZA) 18 MG/3ML SOPN Inject into the skin. 10/19/19 10/18/20  [provider]  losartan (COZAAR) 100 MG tablet Take 1 tablet by mouth daily. 07/17/21   [provider]  metFORMIN (GLUCOPHAGE) 500 MG tablet Take by mouth. 10/27/20 10/27/21  [provider]  MOUNJARO 5 MG/0.5ML Pen Inject into the skin. 09/26/21   [provider]  pantoprazole (PROTONIX) 40 MG tablet Take 40 mg by mouth daily. 07/12/21   [provider]  pregabalin (LYRICA) 100 MG capsule Take by mouth. 10/27/19   [provider]  spironolactone (ALDACTONE) 25 MG tablet Take 1 tablet by mouth daily. 07/17/21   [provider]  traZODone (DESYREL) 50 MG tablet Take 1-3 tablets each night AS NEEDED for sleep. 03/19/19   [provider]  TRESIBA FLEXTOUCH 200 UNIT/ML FlexTouch Pen Inject into the skin. 10/11/21   [provider]  ziprasidone (GEODON) 60 MG capsule Take by mouth. 10/19/19   [provider]  fexofenadine (ALLEGRA) 60 MG tablet Take 1 tablet (60 mg total) by mouth 2 (two) times daily. 08/07/16 01/22/20  Lutricia Feiloemer, William P, PA-C  lisinopril (PRINIVIL,ZESTRIL) 10 MG tablet Take 10 mg by mouth daily.  01/22/20  [provider]    Family History Family History  Problem Relation Age of Onset   Hypertension Mother    Heart disease Mother    Kidney disease Father    Heart disease Father    Hypertension Father     Social History Social History   Tobacco Use   Smoking status: Former    Types: Cigarettes    Quit date: 08/19/2013    Years since quitting: 8.1   Smokeless tobacco: Never   Vaping Use   Vaping Use: Never used  Substance Use Topics   Alcohol use: No   Drug use: No     Allergies   Ace inhibitors, Ciprofloxacin, Morphine and related, and Penicillins   Review of Systems Review of Systems Defer to HPI    Physical Exam Triage Vital Signs ED Triage Vitals [10/16/21 1403]  Enc Vitals Group     BP (!) 159/88     Pulse Rate 93     Resp 16     Temp 97.8 F (36.6 C)     Temp Source Oral     SpO2 100 %     Weight      Height      Head Circumference      Peak Flow      Pain Score  Pain Loc      Pain Edu?      Excl. in GC?    No data found.  Updated Vital Signs BP (!) 159/88 (BP Location: Left Arm)    Pulse 93    Temp 97.8 F (36.6 C) (Oral)    Resp 16    SpO2 100%   Visual Acuity Right Eye Distance:   Left Eye Distance:   Bilateral Distance:    Right Eye Near:   Left Eye Near:    Bilateral Near:     Physical Exam Vitals and nursing note reviewed.  Constitutional:      Appearance: Normal appearance.  HENT:     Head: Normocephalic.  Eyes:     Extraocular Movements: Extraocular movements intact.  Pulmonary:     Effort: Pulmonary effort is normal.  Abdominal:     General: Abdomen is flat. Bowel sounds are normal.     Palpations: Abdomen is soft.     Tenderness: There is abdominal tenderness. There is no right CVA tenderness or left CVA tenderness.  Skin:    General: Skin is warm and dry.  Neurological:     Mental Status: She is alert and oriented to person, place, and time. Mental status is at baseline.  Psychiatric:        Mood and Affect: Mood normal.        Behavior: Behavior normal.     UC Treatments / Results  Labs (all labs ordered are listed, but only abnormal results are displayed) Labs Reviewed  URINALYSIS, ROUTINE W REFLEX MICROSCOPIC    EKG   Radiology No results found.  Procedures Procedures (including critical care time)  Medications Ordered in UC Medications - No data to display  Initial  Impression / Assessment and Plan / UC Course  I have reviewed the triage vital signs and the nursing notes.  Pertinent labs & imaging results that were available during my care of the patient were reviewed by me and considered in my medical decision making (see chart for details).  Clinical Course as of 10/16/21 1436  Mon Oct 16, 2021  1427 Bacteria, UA(!): MANY [AW]    Clinical Course User Index [AW] Valinda Hoar, NP   Acute cystitis without hematuria  Urinalysis positive for Yotam Rhine blood cells and many bacteria underneath the microscope, sent for culture, Keflex 7-day course prescribed, Diflucan prescribed as well to prevent yeast infection, discussed administration of medication, recommended increase fluid intake and good feminine hygiene, may follow-up with PCP or urgent care for persisting symptoms Final Clinical Impressions(s) / UC Diagnoses   Final diagnoses:  None   Discharge Instructions   None    ED Prescriptions   None    PDMP not reviewed this encounter.   Valinda Hoar, NP 10/16/21 1440

## 2021-10-16 NOTE — Discharge Instructions (Signed)
Your urinalysis was positive for urinary infection, it has been sent to the lab to determine exactly what bacteria is present, if any changes need to be made to your medication you will be notified ? ?Begin use of Keflex taking twice daily for the next 7 days, if you have any reaction to this medication please stop the use, take a dose of Benadryl and notify the urgent care, if you have any trouble breathing after use of medication you will need to be seen in person in urgent care or the emergency department ? ?Take 1 Diflucan capsule today then take second dose of Diflucan after completion of all antibiotic ? ?Attempt to increase water intake to further help facilitate emptying of kidneys ? ?As always practice good feminine hygiene, wiping from front to back, urinating after sexual encounters, avoidance of fragrance products ?

## 2021-10-16 NOTE — ED Triage Notes (Signed)
Pt c/o dysuria  x 1 week. She was treated two weeks ago for UTI.  ?

## 2021-10-19 LAB — URINE CULTURE: Culture: >=100000 — AB

## 2021-10-24 IMAGING — CR DG CHEST 2V
2 series · 2 of 2 positions shown · non-contrast
Comparison: May 31, 2016

CLINICAL DATA: Cough with runny nose and congestion.

EXAM:
CHEST - 2 VIEW

[chest pa]
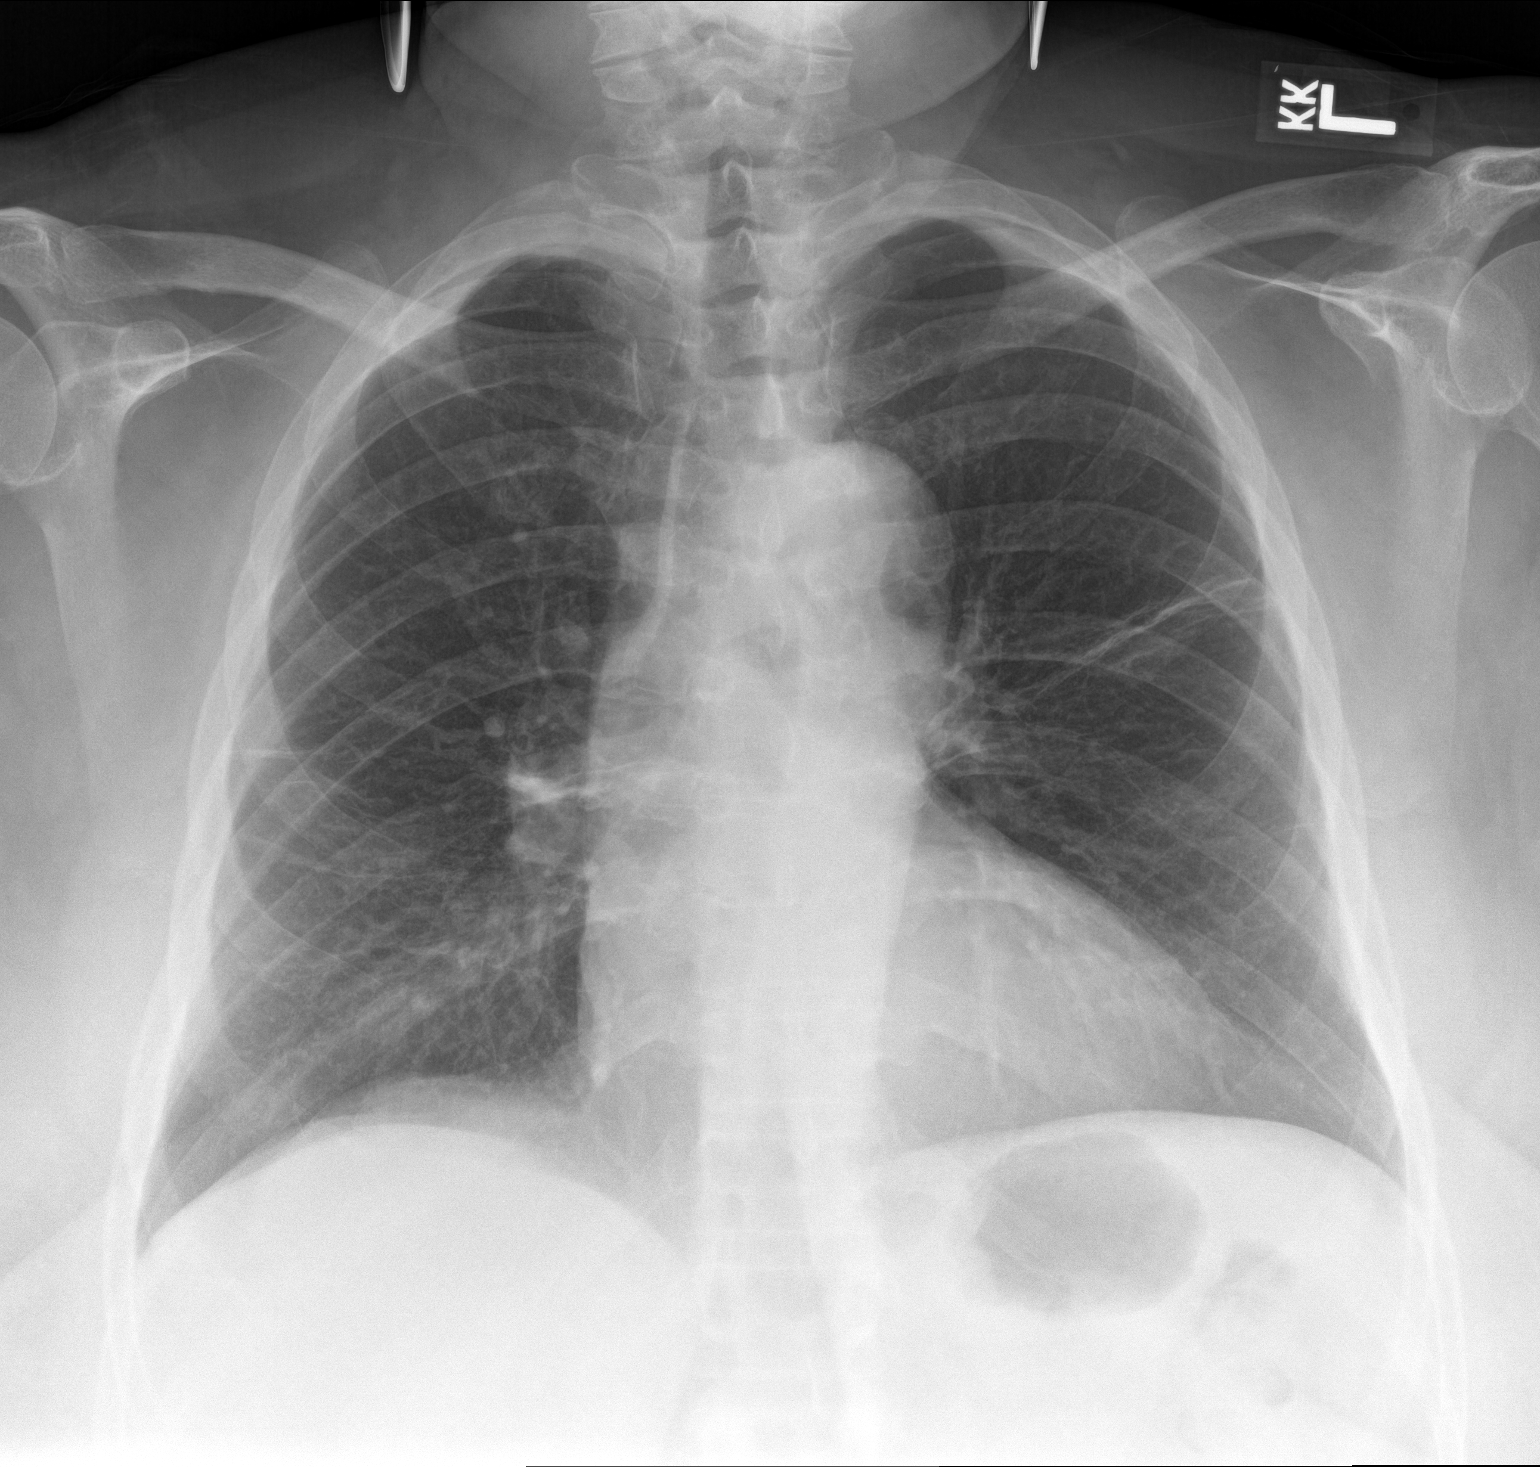

[chest lat]
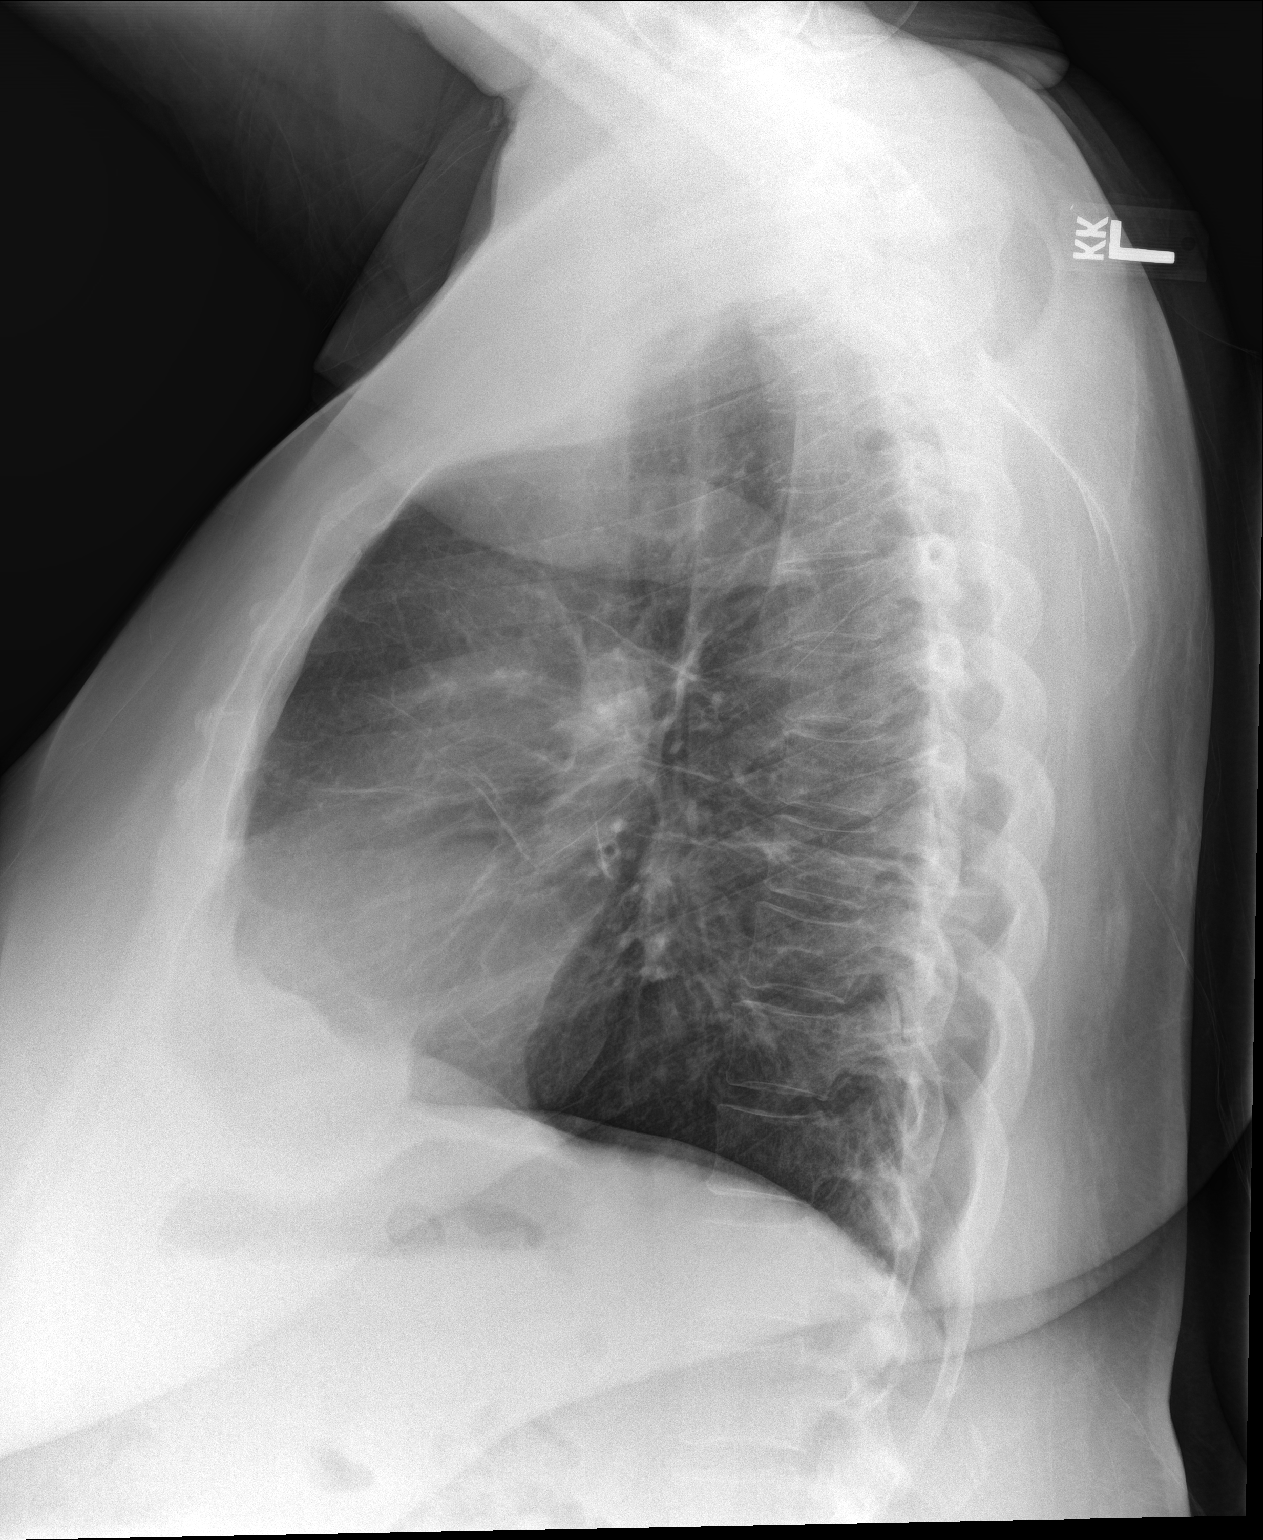

[2 of 2 positions shown; findings below may reference images not displayed]

FINDINGS: Mild linear atelectasis is seen within the mid lung fields,
bilaterally. There is no evidence of acute infiltrate, pleural
effusion or pneumothorax. The heart size and mediastinal contours
are within normal limits. The visualized skeletal structures are
unremarkable.
IMPRESSION: No active cardiopulmonary disease.

## 2022-04-19 ENCOUNTER — Ambulatory Visit: Payer: Medicare Other | Admitting: Podiatry

## 2022-06-15 ENCOUNTER — Ambulatory Visit (INDEPENDENT_AMBULATORY_CARE_PROVIDER_SITE_OTHER): Payer: Medicare Other

## 2022-06-15 ENCOUNTER — Ambulatory Visit
Admission: EM | Admit: 2022-06-15 | Discharge: 2022-06-15 | Disposition: A | Discharge status: Home / Self Care | Payer: Medicare Other | Attending: Emergency Medicine | Admitting: Emergency Medicine

## 2022-06-15 DIAGNOSIS — M7989 Other specified soft tissue disorders: Secondary | ICD-10-CM | POA: Diagnosis present

## 2022-06-15 DIAGNOSIS — R600 Localized edema: Secondary | ICD-10-CM | POA: Diagnosis present

## 2022-06-15 DIAGNOSIS — R0602 Shortness of breath: Secondary | ICD-10-CM

## 2022-06-15 LAB — COMPREHENSIVE METABOLIC PANEL
ALT: 12 U/L (ref 0–44)
AST: 16 U/L (ref 15–41)
Albumin: 3.5 g/dL (ref 3.5–5.0)
Alkaline Phosphatase: 63 U/L (ref 38–126)
Anion gap: 4 — ABNORMAL LOW (ref 5–15)
BUN: 15 mg/dL (ref 8–23)
CO2: 27 mmol/L (ref 22–32)
Calcium: 8.5 mg/dL — ABNORMAL LOW (ref 8.9–10.3)
Chloride: 102 mmol/L (ref 98–111)
Creatinine, Ser: 1.14 mg/dL — ABNORMAL HIGH (ref 0.44–1.00)
GFR, Estimated: 53 mL/min — ABNORMAL LOW (ref >60)
Glucose, Bld: 266 mg/dL — ABNORMAL HIGH (ref 70–99)
Potassium: 3.5 mmol/L (ref 3.5–5.1)
Sodium: 133 mmol/L — ABNORMAL LOW (ref 135–145)
Total Bilirubin: 0.3 mg/dL (ref 0.3–1.2)
Total Protein: 7.7 g/dL (ref 6.5–8.1)

## 2022-06-15 LAB — BRAIN NATRIURETIC PEPTIDE: B Natriuretic Peptide: 53.6 pg/mL (ref 0.0–100.0)

## 2022-06-15 MED ORDER — FUROSEMIDE 20 MG PO TABS: 20.0000 mg | ORAL_TABLET | Freq: Every day | ORAL | 0 refills | Status: DC

## 2022-06-15 MED ORDER — TRAMADOL HCL 50 MG PO TABS: 50.0000 mg | ORAL_TABLET | Freq: Four times a day (QID) | ORAL | 0 refills | Status: AC | PRN

## 2022-06-15 NOTE — ED Provider Notes (Signed)
[facility]    CSN: [id number] Arrival date & time: 06/15/22  1554      History   Chief Complaint Chief Complaint  Patient presents with   Leg Swelling   Foot Swelling    HPI Michelle [name] is a 66 y.o. female.   Patient presents with bilateral hand and feet swelling.  Believes symptoms are worsening.  Pain is described as pressure and intensifies her baseline neuropathy.  Dors is that she has begun to experience shortness of breath during nighttime, described as feeling as if she needs to catch her breath.  Able to bear weight.  Has full range of motion of hands.  Unable to fully flex toes as it elicits pain.  Has attempted use of her prescribed daily gabapentin and Tylenol which have been minimally effective.  History of diabetes, hypertension, hyperlipidemia.  Denies further heart conditions or issues with kidney.  Denies chest pain or tightness, wheezing, cough.     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   Mastodynia 12/26/2016   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/23/2015   Recurrent major depressive disorder, in partial remission (HCC) 07/19/2015   Mild intermittent asthma without complication 01/08/2014   Depression 11/23/2013   Essential hypertension 10/08/2013   Hyperlipidemia 12/25/2011   Type 2 diabetes mellitus with complication, with long-term current use of insulin (HCC) 12/25/2011     Past Surgical History:  Procedure Laterality Date   ABDOMINAL HYSTERECTOMY  1998    OB History     Gravida  5   Para  4   Term      Preterm      AB      Living         SAB      IAB      Ectopic      Multiple      Live Births               Home Medications    Prior to Admission medications   Medication Sig Start Date End Date Taking? Authorizing Provider  albuterol (PROVENTIL HFA;VENTOLIN HFA) 108 (90 BASE) MCG/ACT inhaler Inhale 2 puffs into the lungs every 4 (four) hours as needed for wheezing or shortness of breath. 12/08/14  Yes Defelice, Para March, NP  aspirin 81 MG EC tablet Take 1 tablet by mouth daily. 02/29/20  Yes [provider]  atorvastatin (LIPITOR) 80 MG tablet Take 1 tablet by mouth daily. 10/27/20  Yes [provider]  budesonide-formoterol (SYMBICORT) 160-4.5 MCG/ACT inhaler Inhale 2 puffs into the lungs 1 day or 1 dose. 01/22/20  Yes Cheron Schaumann K, PA-C  celecoxib (CELEBREX) 100 MG capsule Take 100 mg by mouth 2 (two) times daily. 08/14/21  Yes [provider]  chlorthalidone (HYGROTON) 25 MG tablet Take by mouth. 10/19/19  Yes [provider]  dapagliflozin propanediol (FARXIGA) 5 MG TABS tablet Take by mouth. 10/21/18  Yes [provider]  DULoxetine (CYMBALTA) 60 MG capsule Take by mouth. 09/17/21  Yes [provider]  fluticasone (FLONASE) 50 MCG/ACT nasal spray Place into the nose. 05/08/20  Yes [provider]  gabapentin (NEURONTIN) 300 MG capsule Take 300 mg by mouth 3 (three) times daily. 08/14/21  Yes [provider]  hydrALAZINE (APRESOLINE) 50 MG tablet Take by mouth. 10/27/19  Yes [provider]  hydrOXYzine (ATARAX) 50 MG tablet Take by mouth. 02/19/21  Yes [provider]  insulin aspart (NOVOLOG) 100 UNIT/ML injection 5u TID [facility] 03/31/20  Yes [provider]  insulin glargine (LANTUS) 100 UNIT/ML injection Inject 75 Units into the skin at  bedtime.   Yes [provider]  losartan (COZAAR) 100 MG tablet Take 1 tablet by mouth daily. 07/17/21  Yes [provider]  pantoprazole (PROTONIX) 40 MG tablet Take 40 mg by mouth daily. 07/12/21  Yes [provider]  pregabalin (LYRICA) 100 MG capsule Take by mouth. 10/27/19  Yes [provider]  spironolactone (ALDACTONE) 25 MG tablet Take 1 tablet by mouth daily. 07/17/21  Yes [provider]  traZODone (DESYREL) 50 MG tablet Take 1-3 tablets each night AS NEEDED for sleep. 03/19/19  Yes [provider]  TRESIBA FLEXTOUCH 200 UNIT/ML FlexTouch Pen Inject into the skin. 10/11/21  Yes [provider]  ziprasidone (GEODON) 60 MG capsule Take by mouth. 10/19/19  Yes [provider]  amLODipine (NORVASC) 10 MG tablet Take 1 tablet by mouth daily. 10/27/20 10/27/21  [provider]  atorvastatin (LIPITOR) 40 MG tablet Take by mouth. 10/21/18   [provider]  carvedilol (COREG) 25 MG tablet Take by mouth. 02/20/19 02/20/20  [provider]  cetirizine (ZYRTEC) 10 MG tablet Take 1 tablet by mouth daily. 11/30/20 11/30/21  [provider]  erythromycin ophthalmic ointment Place 1 application into both eyes 4 (four) times daily. For seven days 05/14/16   Renford Dills, NP  liraglutide (VICTOZA) 18 MG/3ML SOPN Inject into the skin. 10/19/19 10/18/20  [provider]  metFORMIN (GLUCOPHAGE) 500 MG tablet Take by mouth. 10/27/20 10/27/21  [provider]  MOUNJARO 5 MG/0.5ML Pen Inject into the skin. 09/26/21   [provider]  fexofenadine (ALLEGRA) 60 MG tablet Take 1 tablet (60 mg total) by mouth 2 (two) times daily. 08/07/16 01/22/20  Lutricia Feil, PA-C  lisinopril (PRINIVIL,ZESTRIL) 10 MG tablet Take 10 mg by mouth daily.  01/22/20  [provider]    Family History Family History  Problem Relation Age of Onset   Hypertension Mother    Heart disease Mother    Kidney  disease Father    Heart disease Father    Hypertension Father     Social History Social History   Tobacco Use   Smoking status: Former    Types: Cigarettes    Quit date: 08/19/2013    Years since quitting: 8.8   Smokeless tobacco: Never  Vaping Use   Vaping Use: Never used  Substance Use Topics   Alcohol use: No   Drug use: No     Allergies   Ace inhibitors, Ciprofloxacin, Morphine and related, and Penicillins   Review of Systems Review of Systems  Constitutional: Negative.   Respiratory: Negative.    Cardiovascular:  Positive for leg swelling. Negative for chest pain and palpitations.  Gastrointestinal: Negative.   Skin: Negative.   Neurological: Negative.      Physical Exam Triage Vital Signs ED Triage Vitals  Enc Vitals Group  BP 06/15/22 1629 (!) 192/93     Pulse Rate 06/15/22 1629 97     Resp 06/15/22 1629 18     Temp 06/15/22 1629 98.1 F (36.7 C)     Temp Source 06/15/22 1629 Oral     SpO2 06/15/22 1629 99 %     Weight 06/15/22 1626 210 lb (95.3 kg)     Height 06/15/22 1626 5\' 4"  (1.626 m)     Head Circumference --      Peak Flow --      Pain Score 06/15/22 1624 10     Pain Loc --      Pain Edu? --      Excl. in GC? --    No data found.  Updated Vital Signs BP (!) 192/93 (BP Location: Left Arm)   Pulse 97   Temp 98.1 F (36.7 C) (Oral)   Resp 18   Ht 5\' 4"  (1.626 m)   Wt 210 lb (95.3 kg)   SpO2 99%   BMI 36.05 kg/m   Visual Acuity Right Eye Distance:   Left Eye Distance:   Bilateral Distance:    Right Eye Near:   Left Eye Near:    Bilateral Near:     Physical Exam Constitutional:      Appearance: Normal appearance.  HENT:     Head: Normocephalic.  Eyes:     Extraocular Movements: Extraocular movements intact.  Cardiovascular:     Rate and Rhythm: Normal rate and regular rhythm.     Pulses: Normal pulses.     Heart sounds: Normal heart sounds.  Pulmonary:     Effort: Pulmonary effort is normal.     Breath sounds:  Normal breath sounds.  Musculoskeletal:     Comments: Mild generalized swelling and tenderness to the bilateral hands without ecchymosis, deformity, range of motion of all 10 digits intact less than 3, sensation intact, 2+ radial pulse bilaterally  2+ edema to the right lower extremity and foot, tender to palpation, able to bear weight, range of motion intact, 2+ popliteal and dorsalis pedis pulse  1+ edema to the left lower extremity,  tender to palpation, able to bear weight, range of motion intact, 2+ popliteal and dorsalis pedis pulse  Neurological:     Mental Status: She is alert and oriented to person, place, and time. Mental status is at baseline.  Psychiatric:        Mood and Affect: Mood normal.        Behavior: Behavior normal.      UC Treatments / Results  Labs (all labs ordered are listed, but only abnormal results are displayed) Labs Reviewed - No data to display  EKG   Radiology No results found.  Procedures Procedures (including critical care time)  Medications Ordered in UC Medications - No data to display  Initial Impression / Assessment and Plan / UC Course  I have reviewed the triage vital signs and the nursing notes.  Pertinent labs & imaging results that were available during my care of the patient were reviewed by me and considered in my medical decision making (see chart for details).  Bilateral lower extremity swelling, bilateral hand swelling  Patient is nontoxic nor in signs of distress, lungs are clear to auscultation, O2 saturation 99% on room air, pitting is noted to the lower extremities well mild swelling is generalized to the bilateral hands, chest x-ray is negative, CMP and BMP are pending, patient placed on 7-day course of furosemide 20 mg  and prescribed tramadol for management of pain, recommended compression stockings for additional supportive measures, advised to notify PCP Monday and attempt to get sooner appointment as she has upcoming  appointment in 2 weeks, given strict precautions that if symptoms worsen at any point she is to go to the spittle for immediate evaluation Final Clinical Impressions(s) / UC Diagnoses   Final diagnoses:  None   Discharge Instructions   None    ED Prescriptions   None    PDMP not reviewed this encounter.   Valinda Hoar, NP 06/15/22 1737

## 2022-06-15 NOTE — Discharge Instructions (Addendum)
At This time we do not have a cause for your symptoms  Lab work is pending, you will be notified of any concerning values on how to move forward  Chest  x-ray was negative  Begin use of furosemide daily which is a diuretic, able to hold fluid from your body and help to reduce your swelling, be mindful this medicine will make you have a urgency to go to the restroom  You may use tramadol every 8 hours as needed for severe pain  Begin use of compression socks or stockings, these help to reduce swelling by helping circulate blood flow, may be found at any store that scrubs as well as any online store such as [employer]  Prop feet on pillows when sitting and lying to further help reduce swelling  Please notify your primary doctor on Monday of symptoms and attempt to get a sooner appointment for evaluation  At any point if your symptoms worsen please go to the nearest emergency department for immediate evaluation and further work-up

## 2022-06-15 NOTE — ED Triage Notes (Signed)
Pt c/o hand swelling, leg swelling, and feet swelling x4days  Pt states that she feels pins and needles in her feet and legs.   Pt states that she is a diabetic and has neuropathy.   Pt states that it feels like gout pain.  Pt has taken her medication this morning.

## 2022-07-13 DIAGNOSIS — M8580 Other specified disorders of bone density and structure, unspecified site: Secondary | ICD-10-CM | POA: Insufficient documentation

## 2022-10-12 ENCOUNTER — Ambulatory Visit
Admission: EM | Admit: 2022-10-12 | Discharge: 2022-10-12 | Disposition: A | Discharge status: Home / Self Care | Payer: 59 | Attending: Emergency Medicine | Admitting: Emergency Medicine

## 2022-10-12 ENCOUNTER — Other Ambulatory Visit: Payer: Self-pay

## 2022-10-12 DIAGNOSIS — J4520 Mild intermittent asthma, uncomplicated: Secondary | ICD-10-CM | POA: Insufficient documentation

## 2022-10-12 DIAGNOSIS — Z7985 Long-term (current) use of injectable non-insulin antidiabetic drugs: Secondary | ICD-10-CM | POA: Diagnosis not present

## 2022-10-12 DIAGNOSIS — R519 Headache, unspecified: Secondary | ICD-10-CM | POA: Diagnosis present

## 2022-10-12 DIAGNOSIS — Z20822 Contact with and (suspected) exposure to covid-19: Secondary | ICD-10-CM | POA: Insufficient documentation

## 2022-10-12 DIAGNOSIS — Z794 Long term (current) use of insulin: Secondary | ICD-10-CM | POA: Diagnosis not present

## 2022-10-12 DIAGNOSIS — I10 Essential (primary) hypertension: Secondary | ICD-10-CM | POA: Diagnosis not present

## 2022-10-12 DIAGNOSIS — J069 Acute upper respiratory infection, unspecified: Secondary | ICD-10-CM | POA: Insufficient documentation

## 2022-10-12 DIAGNOSIS — Z7984 Long term (current) use of oral hypoglycemic drugs: Secondary | ICD-10-CM | POA: Diagnosis not present

## 2022-10-12 DIAGNOSIS — E119 Type 2 diabetes mellitus without complications: Secondary | ICD-10-CM | POA: Insufficient documentation

## 2022-10-12 DIAGNOSIS — R059 Cough, unspecified: Secondary | ICD-10-CM | POA: Diagnosis present

## 2022-10-12 DIAGNOSIS — R509 Fever, unspecified: Secondary | ICD-10-CM | POA: Diagnosis present

## 2022-10-12 LAB — RESP PANEL BY RT-PCR (RSV, FLU A&B, COVID)  RVPGX2
Influenza A by PCR: NEGATIVE
Influenza B by PCR: NEGATIVE
Resp Syncytial Virus by PCR: NEGATIVE
SARS Coronavirus 2 by RT PCR: NEGATIVE

## 2022-10-12 MED ORDER — BENZONATATE 100 MG PO CAPS: 200.0000 mg | ORAL_CAPSULE | Freq: Three times a day (TID) | ORAL | 0 refills | Status: DC

## 2022-10-12 MED ORDER — IPRATROPIUM BROMIDE 0.06 % NA SOLN: 2.0000 | Freq: Four times a day (QID) | NASAL | 12 refills | Status: DC

## 2022-10-12 MED ORDER — PROMETHAZINE-DM 6.25-15 MG/5ML PO SYRP: 5.0000 mL | ORAL_SOLUTION | Freq: Four times a day (QID) | ORAL | 0 refills | Status: DC | PRN

## 2022-10-12 NOTE — ED Triage Notes (Signed)
Pt states sx for 3 days. Started with sneezing, phlegm in throat, headache, and developed into cough.

## 2022-10-12 NOTE — ED Provider Notes (Signed)
[facility]    CSN: [id number] Arrival date & time: 10/12/22  0902      History   Chief Complaint Chief Complaint  Patient presents with   Headache   Cough    HPI Michelle [name] is a 67 y.o. female.   HPI  67 year old female here for evaluation of respiratory complaints.  The patient has a past medical history that is significant for hypertension, type 2 diabetes, depression, and asthma presenting for evaluation of headache and sneezing that began 3 days ago with the onset of fever yesterday with a Tmax of 103.  She also Dors is runny nose, nasal congestion, phlegm in her throat, scratchy throat, shortness of breath, body aches, and headache.  She denies any wheezing.  Past Medical History:  Diagnosis Date   Asthma    Depression 2015   Diabetes type 2, uncontrolled 2015   Hypertension     Patient Active Problem List   Diagnosis Date Noted   Thrush 07/11/2021   Noncompliance with medication treatment due to intermittent use of medication 01/03/2021   Cataract, nuclear sclerotic, right eye 10/21/2020   OSA (obstructive sleep apnea) 04/14/2020   Osteoarthritis 08/04/2019   Pain management contract signed 07/07/2019   Diabetic neuropathy ([place]) 04/08/2018   Grief 09/24/2017   MDD (major depressive disorder), recurrent severe, without psychosis ([place]) 09/24/2017   Mastodynia 12/26/2016   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   Recurrent major depressive disorder, in partial remission ([place]) 07/19/2015   Mild intermittent asthma without complication [id number]   Depression 11/23/2013   Essential hypertension 10/08/2013   Hyperlipidemia 12/25/2011   Type 2 diabetes mellitus with complication, with long-term current use of insulin ([place]) 12/25/2011    Past Surgical History:  Procedure Laterality Date   ABDOMINAL HYSTERECTOMY  1998    OB History     Gravida  5   Para  4   Term      Preterm      AB      Living         SAB       IAB      Ectopic      Multiple      Live Births               Home Medications    Prior to Admission medications   Medication Sig Start Date End Date Taking? Authorizing Provider  benzonatate (TESSALON) 100 MG capsule Take 2 capsules (200 mg total) by mouth every 8 (eight) hours. 10/12/22  Yes Margarette Canada, NP  ipratropium (ATROVENT) 0.06 % nasal spray Place 2 sprays into both nostrils 4 (four) times daily. 10/12/22  Yes Margarette Canada, NP  promethazine-dextromethorphan (PROMETHAZINE-DM) 6.25-15 MG/5ML syrup Take 5 mLs by mouth 4 (four) times daily as needed. 10/12/22  Yes Margarette Canada, NP  rosuvastatin (CRESTOR) 20 MG tablet Take 1 tablet by mouth daily. 02/01/22  Yes [provider]  albuterol (PROVENTIL HFA;VENTOLIN HFA) 108 (90 BASE) MCG/ACT inhaler Inhale 2 puffs into the lungs every 4 (four) hours as needed for wheezing or shortness of breath. [id number]   Defelice, Jeanett Schlein, NP  amLODipine (NORVASC) 10 MG tablet Take 1 tablet by mouth daily. 10/27/20 10/27/21  [provider]  aspirin 81 MG EC tablet Take 1 tablet by mouth daily. 02/29/20   [provider]  atorvastatin (LIPITOR) 40 MG tablet Take by mouth. 10/21/18   [provider]  budesonide-formoterol (SYMBICORT) 160-4.5 MCG/ACT inhaler Inhale 2 puffs into the lungs  1 day or 1 dose. 01/22/20   Fransico Meadow, PA-C  carvedilol (COREG) 25 MG tablet Take by mouth. 02/20/19 02/20/20  [provider]  celecoxib (CELEBREX) 100 MG capsule Take 100 mg by mouth 2 (two) times daily. 08/14/21   [provider]  cetirizine (ZYRTEC) 10 MG tablet Take 1 tablet by mouth daily. 11/30/20 11/30/21  [provider]  chlorthalidone (HYGROTON) 25 MG tablet Take by mouth. 10/19/19   [provider]  dapagliflozin propanediol (FARXIGA) 5 MG TABS tablet Take by mouth. 10/21/18   [provider]  DULoxetine (CYMBALTA) 60 MG capsule Take by mouth. 09/17/21   [provider]   fluticasone (FLONASE) 50 MCG/ACT nasal spray Place into the nose. 05/08/20   [provider]  furosemide (LASIX) 20 MG tablet Take 1 tablet (20 mg total) by mouth daily for 7 days. 06/15/22 06/22/22  Hans Eden, NP  gabapentin (NEURONTIN) 300 MG capsule Take 300 mg by mouth 3 (three) times daily. 08/14/21   [provider]  hydrALAZINE (APRESOLINE) 50 MG tablet Take by mouth. 10/27/19   [provider]  hydrOXYzine (ATARAX) 50 MG tablet Take by mouth. 02/19/21   [provider]  insulin aspart (NOVOLOG) 100 UNIT/ML injection 5u TID [facility] 03/31/20   [provider]  insulin glargine (LANTUS) 100 UNIT/ML injection Inject 75 Units into the skin at bedtime.    [provider]  losartan (COZAAR) 100 MG tablet Take 1 tablet by mouth daily. 07/17/21   [provider]  metFORMIN (GLUCOPHAGE) 500 MG tablet Take by mouth. 10/27/20 10/27/21  [provider]  OZEMPIC, 0.25 OR 0.5 MG/DOSE, 2 MG/3ML SOPN Inject into the skin.    [provider]  pantoprazole (PROTONIX) 40 MG tablet Take 40 mg by mouth daily. 07/12/21   [provider]  pregabalin (LYRICA) 100 MG capsule Take by mouth. 10/27/19   [provider]  spironolactone (ALDACTONE) 25 MG tablet Take 1 tablet by mouth daily. 07/17/21   [provider]  traMADol (ULTRAM) 50 MG tablet Take 1 tablet (50 mg total) by mouth every 6 (six) hours as needed. 06/15/22   Hans Eden, NP  traZODone (DESYREL) 50 MG tablet Take 1-3 tablets each night AS NEEDED for sleep. 03/19/19   [provider]  ziprasidone (GEODON) 60 MG capsule Take by mouth. 10/19/19   [provider]  fexofenadine (ALLEGRA) 60 MG tablet Take 1 tablet (60 mg total) by mouth 2 (two) times daily. 08/07/16 01/22/20  Lorin Picket, PA-C  lisinopril (PRINIVIL,ZESTRIL) 10 MG tablet Take 10 mg by mouth daily.  01/22/20  [provider]    Family History Family History   Problem Relation Age of Onset   Hypertension Mother    Heart disease Mother    Kidney disease Father    Heart disease Father    Hypertension Father     Social History Social History   Tobacco Use   Smoking status: Former    Types: Cigarettes    Quit date: 08/19/2013    Years since quitting: 9.1   Smokeless tobacco: Never  Vaping Use   Vaping Use: Never used  Substance Use Topics   Alcohol use: No   Drug use: No     Allergies   Ace inhibitors, Ciprofloxacin, Morphine and related, Penicillins, and Tirzepatide   Review of Systems Review of Systems  Constitutional:  Positive for fever.  HENT:  Positive for congestion, rhinorrhea and sore throat.   Respiratory:  Positive for cough and shortness of breath. Negative for wheezing.   Musculoskeletal:  Positive for arthralgias and myalgias.  Neurological:  Positive for headaches.     Physical Exam Triage Vital Signs ED Triage Vitals [10/12/22 0908]  Enc Vitals Group     BP      Pulse      Resp      Temp      Temp src      SpO2      Weight 215 lb (97.5 kg)     Height '5\' 4"'$  (1.626 m)     Head Circumference      Peak Flow      Pain Score 8     Pain Loc      Pain Edu?      Excl. in [place]?    No data found.  Updated Vital Signs BP (!) 197/97 (BP Location: Left Arm) Comment: Has not taken her b/p meds today  Pulse 79   Temp 97.8 F (36.6 C) (Oral)   Resp 18   Ht '5\' 4"'$  (1.626 m)   Wt 215 lb (97.5 kg)   SpO2 100%   BMI 36.90 kg/m   Visual Acuity Right Eye Distance:   Left Eye Distance:   Bilateral Distance:    Right Eye Near:   Left Eye Near:    Bilateral Near:     Physical Exam Vitals and nursing note reviewed.  Constitutional:      Appearance: Normal appearance. She is not ill-appearing.  HENT:     Head: Normocephalic and atraumatic.     Right Ear: Tympanic membrane, ear canal and external ear normal. There is no impacted cerumen.     Left Ear: Tympanic membrane, ear canal and external ear normal.  There is no impacted cerumen.     Nose: Congestion and rhinorrhea present.     Comments: Erythematous edematous with copious clear discharge in both nares.    Mouth/Throat:     Mouth: Mucous membranes are moist.     Pharynx: Oropharynx is clear. Posterior oropharyngeal erythema present. No oropharyngeal exudate.     Comments: Erythema injection to the posterior oropharynx with clear postnasal drip. Cardiovascular:     Rate and Rhythm: Normal rate.     Pulses: Normal pulses.     Heart sounds: Normal heart sounds. No murmur heard.    No friction rub. No gallop.  Pulmonary:     Effort: Pulmonary effort is normal.     Breath sounds: Normal breath sounds. No wheezing, rhonchi or rales.  Musculoskeletal:     Cervical back: Normal range of motion and neck supple.  Lymphadenopathy:     Cervical: No cervical adenopathy.  Skin:    General: Skin is warm and dry.     Capillary Refill: Capillary refill takes less than 2 seconds.     Findings: No erythema or rash.  Neurological:     General: No focal deficit present.     Mental Status: She is alert and oriented to person, place, and time.      UC Treatments / Results  Labs (all labs ordered are listed, but only abnormal results are displayed) Labs Reviewed  RESP PANEL BY RT-PCR (RSV, FLU A&B, COVID)  [id number]    EKG   Radiology No results found.  Procedures Procedures (including critical care time)  Medications Ordered in UC Medications - No data to display  Initial Impression / Assessment and Plan / UC Course  I have reviewed the  triage vital signs and the nursing notes.  Pertinent labs & imaging results that were available during my care of the patient were reviewed by me and considered in my medical decision making (see chart for details).   Patient is a pleasant, nontoxic-appearing 67 year old female here for evaluation of respiratory symptoms as outlined in HPI above.  She is complaining of shortness of breath but her room  air oxygen saturation is 100% and her respiratory rate is 18.  She is able to speak in full sentences without dyspnea or tachypnea.  She was recently exposed to [place].  Her symptoms began with sneezing and progressed to upper and lower respiratory symptoms over the next 2 days.  She developed a fever last night with a Tmax of 103.  Given that patient began a fever last night I will order a respiratory panel to look for the presence of COVID and flu.  Respiratory panel is negative for COVID, flu, and RSV.  I will discharge patient home with a diagnosis of viral URI with cough and treat her with over-the-counter Tylenol and ibuprofen for her fever, Atrovent nasal spray, Tessalon Perles, and Promethazine DM cough syrup.   Final Clinical Impressions(s) / UC Diagnoses   Final diagnoses:  Viral URI with cough     Discharge Instructions      Your testing today was negative for COVID, RSV, and influenza.  I do believe you have a viral respiratory infection.  Please use over-the-counter Tylenol and/or ibuprofen according to the package instructions as needed for fever and pain.  Use the Atrovent nasal spray, 2 squirts in each nostril every 6 hours, as needed for runny nose and postnasal drip.  Use the Tessalon Perles every 8 hours during the day.  Take them with a small sip of water.  They may give you some numbness to the base of your tongue or a metallic taste in your mouth, this is normal.  Use the Promethazine DM cough syrup at bedtime for cough and congestion.  It will make you drowsy so do not take it during the day.  Return for reevaluation or see your primary care provider for any new or worsening symptoms.      ED Prescriptions     Medication Sig Dispense Auth. Provider   benzonatate (TESSALON) 100 MG capsule Take 2 capsules (200 mg total) by mouth every 8 (eight) hours. 21 capsule Margarette Canada, NP   ipratropium (ATROVENT) 0.06 % nasal spray Place 2 sprays into both nostrils 4  (four) times daily. 15 mL Margarette Canada, NP   promethazine-dextromethorphan (PROMETHAZINE-DM) 6.25-15 MG/5ML syrup Take 5 mLs by mouth 4 (four) times daily as needed. 118 mL Margarette Canada, NP      PDMP not reviewed this encounter.   Margarette Canada, NP 10/12/22 1037

## 2022-10-12 NOTE — Discharge Instructions (Addendum)
Your testing today was negative for COVID, RSV, and influenza.  I do believe you have a viral respiratory infection.  Please use over-the-counter Tylenol and/or ibuprofen according to the package instructions as needed for fever and pain.  Use the Atrovent nasal spray, 2 squirts in each nostril every 6 hours, as needed for runny nose and postnasal drip.  Use the Tessalon Perles every 8 hours during the day.  Take them with a small sip of water.  They may give you some numbness to the base of your tongue or a metallic taste in your mouth, this is normal.  Use the Promethazine DM cough syrup at bedtime for cough and congestion.  It will make you drowsy so do not take it during the day.  Return for reevaluation or see your primary care provider for any new or worsening symptoms.

## 2023-03-07 ENCOUNTER — Ambulatory Visit
Admission: EM | Admit: 2023-03-07 | Discharge: 2023-03-07 | Disposition: A | Discharge status: Home / Self Care | Payer: 59 | Attending: Emergency Medicine | Admitting: Emergency Medicine

## 2023-03-07 DIAGNOSIS — J0140 Acute pansinusitis, unspecified: Secondary | ICD-10-CM

## 2023-03-07 MED ORDER — PROMETHAZINE-DM 6.25-15 MG/5ML PO SYRP: 5.0000 mL | ORAL_SOLUTION | Freq: Four times a day (QID) | ORAL | 0 refills | Status: DC | PRN

## 2023-03-07 MED ORDER — BENZONATATE 100 MG PO CAPS: 200.0000 mg | ORAL_CAPSULE | Freq: Three times a day (TID) | ORAL | 0 refills | Status: DC

## 2023-03-07 MED ORDER — ALBUTEROL SULFATE HFA 108 (90 BASE) MCG/ACT IN AERS
2.0000 | INHALATION_SPRAY | RESPIRATORY_TRACT | 0 refills | Status: DC | PRN
Start: 2023-03-07 — End: 2023-06-19

## 2023-03-07 MED ORDER — IPRATROPIUM BROMIDE 0.06 % NA SOLN: 2.0000 | Freq: Four times a day (QID) | NASAL | 12 refills | Status: AC

## 2023-03-07 MED ORDER — DOXYCYCLINE HYCLATE 100 MG PO CAPS: 100.0000 mg | ORAL_CAPSULE | Freq: Two times a day (BID) | ORAL | 0 refills | Status: DC

## 2023-03-07 NOTE — Discharge Instructions (Signed)
The Doxycycline twice daily with food for 10 days for treatment of your sinusitis.  Perform sinus irrigation 2-3 times a day with a [employer] sinus rinse kit and distilled water.  Do not use tap water.  You can use plain over-the-counter Mucinex every 6 hours to break up the stickiness of the mucus so your body can clear it.  Increase your oral fluid intake to thin out your mucus so that is also able for your body to clear more easily.  Take an over-the-counter probiotic, such as Culturelle-align-activia, 1 hour after each dose of antibiotic to prevent diarrhea.  Use the Atrovent nasal spray, 2 squirts in each nostril every 6 hours, as needed for runny nose and postnasal drip.  Use the Tessalon Perles every 8 hours during the day.  Take them with a small sip of water.  They may give you some numbness to the base of your tongue or a metallic taste in your mouth, this is normal.  Use the Promethazine DM cough syrup at bedtime for cough and congestion.  It will make you drowsy so do not take it during the day.  Use the Albuterol inhaler, 1-2 puffs every 4-6 hours as needed for shortness fo breath or wheezing.  If you develop any new or worsening symptoms return for reevaluation or see your primary care provider.

## 2023-03-07 NOTE — ED Triage Notes (Signed)
Pt present nausea with shortness of breath and facial pain, symptoms started on Saturday. Pt tried OTC medication with no relief

## 2023-03-07 NOTE — ED Provider Notes (Signed)
[facility]    CSN: [id number] Arrival date & time: 03/07/23  1749      History   Chief Complaint Chief Complaint  Patient presents with   Nausea   Shortness of Breath    HPI Michelle [name] is a 67 y.o. female.   HPI  67 year old female with a past medical history significant for type 2 diabetes, hypertension, depression, and asthma presents for evaluation of left-sided facial pain with associated nausea and mild shortness of breath.  She is also had some occasional wheezing and a nonproductive cough.  She denies any fever, runny nose, nasal congestion, ear pain, or sore throat.  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   Mastodynia 12/26/2016   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/23/2015   Recurrent major depressive disorder, in partial remission (HCC) 07/19/2015   Mild intermittent asthma without complication 01/08/2014   Depression 11/23/2013   Essential hypertension 10/08/2013   Hyperlipidemia 12/25/2011   Type 2 diabetes mellitus with complication, with long-term current use of insulin (HCC) 12/25/2011    Past Surgical History:  Procedure Laterality Date   ABDOMINAL HYSTERECTOMY  1998    OB History     Gravida  5   Para  4   Term      Preterm      AB      Living         SAB      IAB      Ectopic      Multiple      Live Births               Home Medications     Prior to Admission medications   Medication Sig Start Date End Date Taking? Authorizing Provider  albuterol (VENTOLIN HFA) 108 (90 Base) MCG/ACT inhaler Inhale 2 puffs into the lungs every 4 (four) hours as needed. 03/07/23  Yes Becky Augusta, NP  benzonatate (TESSALON) 100 MG capsule Take 2 capsules (200 mg total) by mouth every 8 (eight) hours. 03/07/23  Yes Becky Augusta, NP  doxycycline (VIBRAMYCIN) 100 MG capsule Take 1 capsule (100 mg total) by mouth 2 (two) times daily. 03/07/23  Yes Becky Augusta, NP  ipratropium (ATROVENT) 0.06 % nasal spray Place 2 sprays into both nostrils 4 (four) times daily. 03/07/23  Yes Becky Augusta, NP  promethazine-dextromethorphan (PROMETHAZINE-DM) 6.25-15 MG/5ML syrup Take 5 mLs by mouth 4 (four) times daily as needed. 03/07/23  Yes Becky Augusta, NP  albuterol (PROVENTIL HFA;VENTOLIN HFA) 108 (90 BASE) MCG/ACT inhaler Inhale 2 puffs into the lungs every 4 (four) hours as needed for wheezing or shortness of breath. 12/08/14   Defelice, Para March, NP  amLODipine (NORVASC) 10 MG tablet Take 1 tablet by mouth daily. 10/27/20 10/27/21  [provider]  aspirin 81 MG EC tablet Take 1 tablet by mouth daily. 02/29/20   [provider]  atorvastatin (LIPITOR) 40 MG tablet Take by mouth. 10/21/18  [provider]  budesonide-formoterol (SYMBICORT) 160-4.5 MCG/ACT inhaler Inhale 2 puffs into the lungs 1 day or 1 dose. 01/22/20   Elson Areas, PA-C  carvedilol (COREG) 25 MG tablet Take by mouth. 02/20/19 02/20/20  [provider]  celecoxib (CELEBREX) 100 MG capsule Take 100 mg by mouth 2 (two) times daily. 08/14/21   [provider]  cetirizine (ZYRTEC) 10 MG tablet Take 1 tablet by mouth daily. 11/30/20 11/30/21  [provider]  chlorthalidone (HYGROTON) 25 MG tablet Take by mouth. 10/19/19   [provider]  dapagliflozin propanediol (FARXIGA) 5 MG TABS tablet Take by mouth. 10/21/18   [provider]  DULoxetine  (CYMBALTA) 60 MG capsule Take by mouth. 09/17/21   [provider]  fluticasone (FLONASE) 50 MCG/ACT nasal spray Place into the nose. 05/08/20   [provider]  furosemide (LASIX) 20 MG tablet Take 1 tablet (20 mg total) by mouth daily for 7 days. 06/15/22 06/22/22  Valinda Hoar, NP  gabapentin (NEURONTIN) 300 MG capsule Take 300 mg by mouth 3 (three) times daily. 08/14/21   [provider]  hydrALAZINE (APRESOLINE) 50 MG tablet Take by mouth. 10/27/19   [provider]  hydrOXYzine (ATARAX) 50 MG tablet Take by mouth. 02/19/21   [provider]  insulin aspart (NOVOLOG) 100 UNIT/ML injection 5u TID [facility] 03/31/20   [provider]  insulin glargine (LANTUS) 100 UNIT/ML injection Inject 75 Units into the skin at bedtime.    [provider]  losartan (COZAAR) 100 MG tablet Take 1 tablet by mouth daily. 07/17/21   [provider]  metFORMIN (GLUCOPHAGE) 500 MG tablet Take by mouth. 10/27/20 10/27/21  [provider]  OZEMPIC, 0.25 OR 0.5 MG/DOSE, 2 MG/3ML SOPN Inject into the skin.    [provider]  pantoprazole (PROTONIX) 40 MG tablet Take 40 mg by mouth daily. 07/12/21   [provider]  pregabalin (LYRICA) 100 MG capsule Take by mouth. 10/27/19   [provider]  rosuvastatin (CRESTOR) 20 MG tablet Take 1 tablet by mouth daily. 02/01/22   [provider]  spironolactone (ALDACTONE) 25 MG tablet Take 1 tablet by mouth daily. 07/17/21   [provider]  traMADol (ULTRAM) 50 MG tablet Take 1 tablet (50 mg total) by mouth every 6 (six) hours as needed. 06/15/22   Valinda Hoar, NP  traZODone (DESYREL) 50 MG tablet Take 1-3 tablets each night AS NEEDED for sleep. 03/19/19   [provider]  ziprasidone (GEODON) 60 MG capsule Take by mouth. 10/19/19   [provider]  fexofenadine (ALLEGRA) 60 MG tablet Take 1 tablet (60 mg total) by mouth 2 (two) times daily. 08/07/16  01/22/20  Lutricia Feil, PA-C  lisinopril (PRINIVIL,ZESTRIL) 10 MG tablet Take 10 mg by mouth daily.  01/22/20  [provider]    Family History Family History  Problem Relation Age of Onset   Hypertension Mother    Heart disease Mother    Kidney disease Father    Heart disease Father    Hypertension Father     Social History Social History   Tobacco Use   Smoking status: Former    Current packs/day: 0.00    Types: Cigarettes    Quit date: 08/19/2013    Years since quitting: 9.5   Smokeless tobacco: Never  Vaping Use   Vaping status: Never Used  Substance Use Topics   Alcohol use: No   Drug use: No  Allergies   Ace inhibitors, Ciprofloxacin, Morphine and codeine, Penicillins, and Tirzepatide   Review of Systems Review of Systems  Constitutional:  Negative for fever.  HENT:  Positive for sinus pain. Negative for congestion, rhinorrhea and sore throat.   Respiratory:  Positive for cough, shortness of breath and wheezing.      Physical Exam Triage Vital Signs ED Triage Vitals  Encounter Vitals Group     BP      Systolic BP Percentile      Diastolic BP Percentile      Pulse      Resp      Temp      Temp src      SpO2      Weight      Height      Head Circumference      Peak Flow      Pain Score      Pain Loc      Pain Education      Exclude from Growth Chart    No data found.  Updated Vital Signs BP (!) 174/91 (BP Location: Left Arm)   Pulse 79   Temp 98.2 F (36.8 C) (Oral)   Resp 18   SpO2 97%   Visual Acuity Right Eye Distance:   Left Eye Distance:   Bilateral Distance:    Right Eye Near:   Left Eye Near:    Bilateral Near:     Physical Exam Vitals and nursing note reviewed.  Constitutional:      Appearance: Normal appearance. She is not ill-appearing.  HENT:     Head: Normocephalic and atraumatic.     Right Ear: Tympanic membrane, ear canal and external ear normal. There is no impacted cerumen.     Left Ear:  Tympanic membrane, ear canal and external ear normal. There is no impacted cerumen.     Nose: Congestion and rhinorrhea present.     Comments: His mucosa is erythematous and edematous with yellow discharge in both nares.  Patient has tenderness to bilateral maxillary and frontal sinuses to compression.    Mouth/Throat:     Mouth: Mucous membranes are moist.     Pharynx: Oropharynx is clear. Posterior oropharyngeal erythema present. No oropharyngeal exudate.     Comments: Mild erythema to the posterior oropharynx with yellow postnasal drip. Cardiovascular:     Rate and Rhythm: Normal rate and regular rhythm.     Pulses: Normal pulses.     Heart sounds: Normal heart sounds. No murmur heard.    No friction rub. No gallop.  Pulmonary:     Effort: Pulmonary effort is normal.     Breath sounds: Normal breath sounds. No wheezing, rhonchi or rales.  Musculoskeletal:     Cervical back: Normal range of motion and neck supple. No tenderness.  Lymphadenopathy:     Cervical: No cervical adenopathy.  Skin:    General: Skin is warm and dry.     Capillary Refill: Capillary refill takes less than 2 seconds.     Findings: No erythema or rash.  Neurological:     General: No focal deficit present.     Mental Status: She is alert and oriented to person, place, and time.      UC Treatments / Results  Labs (all labs ordered are listed, but only abnormal results are displayed) Labs Reviewed - No data to display  EKG   Radiology No results found.  Procedures Procedures (including critical care time)  Medications Ordered  in UC Medications - No data to display  Initial Impression / Assessment and Plan / UC Course  I have reviewed the triage vital signs and the nursing notes.  Pertinent labs & imaging results that were available during my care of the patient were reviewed by me and considered in my medical decision making (see chart for details).   Patient is a nontoxic-appearing 67 year old  female presenting for evaluation of sinus complaints that started 5 days ago as outlined in HPI above.  She is reporting that she feels short of breath but she is able speak in full sentence without dyspnea or tachypnea.  Room her oxygen saturation is 97% and her respiratory rate in triage is 18.  She is afebrile with an oral temp of 98.2.  She does have marked edema and erythema of nasal mucosa bilaterally with tenderness to compression of both frontal and maxillary sinuses bilaterally.  There is yellow discharge in both nares as well as yellow postnasal drip in the posterior oropharynx.  Her lungs are clear to auscultation all fields.  I will treat the patient for pansinusitis with doxycycline 100 mg twice daily for 10 days along with Atrovent nasal spray to help with the nasal congestion.  Tessalon Perles and Promethazine DM cough syrup for cough and congestion.  She reports that she has been using her albuterol inhaler as needed for shortness of breath and wheezing that has been helping.  She also reports that she needs a refill so I will refill her albuterol inhaler and she can continue using as needed.  Return precautions reviewed.   Final Clinical Impressions(s) / UC Diagnoses   Final diagnoses:  Acute non-recurrent pansinusitis     Discharge Instructions      The Doxycycline twice daily with food for 10 days for treatment of your sinusitis.  Perform sinus irrigation 2-3 times a day with a [employer] sinus rinse kit and distilled water.  Do not use tap water.  You can use plain over-the-counter Mucinex every 6 hours to break up the stickiness of the mucus so your body can clear it.  Increase your oral fluid intake to thin out your mucus so that is also able for your body to clear more easily.  Take an over-the-counter probiotic, such as Culturelle-align-activia, 1 hour after each dose of antibiotic to prevent diarrhea.  Use the Atrovent nasal spray, 2 squirts in each nostril every 6 hours,  as needed for runny nose and postnasal drip.  Use the Tessalon Perles every 8 hours during the day.  Take them with a small sip of water.  They may give you some numbness to the base of your tongue or a metallic taste in your mouth, this is normal.  Use the Promethazine DM cough syrup at bedtime for cough and congestion.  It will make you drowsy so do not take it during the day.  Use the Albuterol inhaler, 1-2 puffs every 4-6 hours as needed for shortness fo breath or wheezing.  If you develop any new or worsening symptoms return for reevaluation or see your primary care provider.      ED Prescriptions     Medication Sig Dispense Auth. Provider   doxycycline (VIBRAMYCIN) 100 MG capsule Take 1 capsule (100 mg total) by mouth 2 (two) times daily. 20 capsule Becky Augusta, NP   ipratropium (ATROVENT) 0.06 % nasal spray Place 2 sprays into both nostrils 4 (four) times daily. 15 mL Becky Augusta, NP   benzonatate (TESSALON) 100 MG capsule  Take 2 capsules (200 mg total) by mouth every 8 (eight) hours. 21 capsule Becky Augusta, NP   promethazine-dextromethorphan (PROMETHAZINE-DM) 6.25-15 MG/5ML syrup Take 5 mLs by mouth 4 (four) times daily as needed. 118 mL Becky Augusta, NP   albuterol (VENTOLIN HFA) 108 (90 Base) MCG/ACT inhaler Inhale 2 puffs into the lungs every 4 (four) hours as needed. 18 g Becky Augusta, NP      PDMP not reviewed this encounter.   Becky Augusta, NP 03/07/23 [phone]

## 2023-05-04 DIAGNOSIS — E11319 Type 2 diabetes mellitus with unspecified diabetic retinopathy without macular edema: Secondary | ICD-10-CM | POA: Insufficient documentation

## 2023-06-18 ENCOUNTER — Emergency Department: Payer: 59

## 2023-06-18 ENCOUNTER — Inpatient Hospital Stay: Payer: 59

## 2023-06-18 ENCOUNTER — Other Ambulatory Visit: Payer: Self-pay

## 2023-06-18 ENCOUNTER — Observation Stay
Admission: EM | Admit: 2023-06-18 | Discharge: 2023-06-19 | Disposition: A | Discharge status: Home / Self Care | Payer: 59 | Attending: Internal Medicine | Admitting: Internal Medicine

## 2023-06-18 DIAGNOSIS — Z794 Long term (current) use of insulin: Secondary | ICD-10-CM

## 2023-06-18 DIAGNOSIS — Z7984 Long term (current) use of oral hypoglycemic drugs: Secondary | ICD-10-CM | POA: Diagnosis not present

## 2023-06-18 DIAGNOSIS — Z87891 Personal history of nicotine dependence: Secondary | ICD-10-CM | POA: Insufficient documentation

## 2023-06-18 DIAGNOSIS — E118 Type 2 diabetes mellitus with unspecified complications: Secondary | ICD-10-CM

## 2023-06-18 DIAGNOSIS — R2990 Unspecified symptoms and signs involving the nervous system: Secondary | ICD-10-CM | POA: Diagnosis present

## 2023-06-18 DIAGNOSIS — Z79899 Other long term (current) drug therapy: Secondary | ICD-10-CM | POA: Diagnosis not present

## 2023-06-18 DIAGNOSIS — Z7982 Long term (current) use of aspirin: Secondary | ICD-10-CM | POA: Diagnosis not present

## 2023-06-18 DIAGNOSIS — Z9889 Other specified postprocedural states: Secondary | ICD-10-CM | POA: Insufficient documentation

## 2023-06-18 DIAGNOSIS — I639 Cerebral infarction, unspecified: Secondary | ICD-10-CM | POA: Diagnosis not present

## 2023-06-18 DIAGNOSIS — M542 Cervicalgia: Secondary | ICD-10-CM | POA: Diagnosis present

## 2023-06-18 DIAGNOSIS — E119 Type 2 diabetes mellitus without complications: Secondary | ICD-10-CM | POA: Insufficient documentation

## 2023-06-18 DIAGNOSIS — E669 Obesity, unspecified: Secondary | ICD-10-CM

## 2023-06-18 DIAGNOSIS — J45909 Unspecified asthma, uncomplicated: Secondary | ICD-10-CM | POA: Diagnosis not present

## 2023-06-18 DIAGNOSIS — I10 Essential (primary) hypertension: Secondary | ICD-10-CM | POA: Diagnosis present

## 2023-06-18 DIAGNOSIS — E1169 Type 2 diabetes mellitus with other specified complication: Secondary | ICD-10-CM

## 2023-06-18 DIAGNOSIS — E785 Hyperlipidemia, unspecified: Secondary | ICD-10-CM | POA: Diagnosis not present

## 2023-06-18 DIAGNOSIS — R531 Weakness: Secondary | ICD-10-CM | POA: Diagnosis present

## 2023-06-18 DIAGNOSIS — G4733 Obstructive sleep apnea (adult) (pediatric): Secondary | ICD-10-CM | POA: Diagnosis present

## 2023-06-18 LAB — URINALYSIS, ROUTINE W REFLEX MICROSCOPIC
Bilirubin Urine: NEGATIVE
Glucose, UA: >=500 mg/dL — AB
Hgb urine dipstick: NEGATIVE
Ketones, ur: NEGATIVE mg/dL
Nitrite: NEGATIVE
Protein, ur: NEGATIVE mg/dL
Specific Gravity, Urine: 1.027 (ref 1.005–1.030)
pH: 6 (ref 5.0–8.0)

## 2023-06-18 LAB — DIFFERENTIAL
Abs Immature Granulocytes: 0.01 10*3/uL (ref 0.00–0.07)
Basophils Absolute: 0 10*3/uL (ref 0.0–0.1)
Basophils Relative: 0 %
Eosinophils Absolute: 0.2 10*3/uL (ref 0.0–0.5)
Eosinophils Relative: 2 %
Immature Granulocytes: 0 %
Lymphocytes Relative: 41 %
Lymphs Abs: 3.2 10*3/uL (ref 0.7–4.0)
Monocytes Absolute: 0.7 10*3/uL (ref 0.1–1.0)
Monocytes Relative: 9 %
Neutro Abs: 3.7 10*3/uL (ref 1.7–7.7)
Neutrophils Relative %: 48 %

## 2023-06-18 LAB — COMPREHENSIVE METABOLIC PANEL
ALT: 12 U/L (ref 0–44)
AST: 15 U/L (ref 15–41)
Albumin: 3.2 g/dL — ABNORMAL LOW (ref 3.5–5.0)
Alkaline Phosphatase: 67 U/L (ref 38–126)
Anion gap: 7 (ref 5–15)
BUN: 22 mg/dL (ref 8–23)
CO2: 25 mmol/L (ref 22–32)
Calcium: 8.4 mg/dL — ABNORMAL LOW (ref 8.9–10.3)
Chloride: 97 mmol/L — ABNORMAL LOW (ref 98–111)
Creatinine, Ser: 1.58 mg/dL — ABNORMAL HIGH (ref 0.44–1.00)
GFR, Estimated: 35 mL/min — ABNORMAL LOW (ref >60)
Glucose, Bld: 453 mg/dL — ABNORMAL HIGH (ref 70–99)
Potassium: 3.7 mmol/L (ref 3.5–5.1)
Sodium: 129 mmol/L — ABNORMAL LOW (ref 135–145)
Total Bilirubin: 0.3 mg/dL (ref <1.2)
Total Protein: 7 g/dL (ref 6.5–8.1)

## 2023-06-18 LAB — CBC
HCT: 37.5 % (ref 36.0–46.0)
Hemoglobin: 11.7 g/dL — ABNORMAL LOW (ref 12.0–15.0)
MCH: 27.9 pg (ref 26.0–34.0)
MCHC: 31.2 g/dL (ref 30.0–36.0)
MCV: 89.5 fL (ref 80.0–100.0)
Platelets: 344 10*3/uL (ref 150–400)
RBC: 4.19 MIL/uL (ref 3.87–5.11)
RDW: 12.2 % (ref 11.5–15.5)
WBC: 7.9 10*3/uL (ref 4.0–10.5)
nRBC: 0 % (ref 0.0–0.2)

## 2023-06-18 LAB — URINE DRUG SCREEN, QUALITATIVE (ARMC ONLY)
Amphetamines, Ur Screen: NOT DETECTED
Barbiturates, Ur Screen: NOT DETECTED
Benzodiazepine, Ur Scrn: NOT DETECTED
Cannabinoid 50 Ng, Ur ~~LOC~~: NOT DETECTED
Cocaine Metabolite,Ur ~~LOC~~: NOT DETECTED
MDMA (Ecstasy)Ur Screen: NOT DETECTED
Methadone Scn, Ur: NOT DETECTED
Opiate, Ur Screen: NOT DETECTED
Phencyclidine (PCP) Ur S: NOT DETECTED
Tricyclic, Ur Screen: NOT DETECTED

## 2023-06-18 LAB — PROTIME-INR
INR: 1.1 (ref 0.8–1.2)
Prothrombin Time: 14.6 s (ref 11.4–15.2)

## 2023-06-18 LAB — HEMOGLOBIN A1C
Hgb A1c MFr Bld: 12.4 % — ABNORMAL HIGH (ref 4.8–5.6)
Mean Plasma Glucose: 309.18 mg/dL

## 2023-06-18 LAB — GLUCOSE, CAPILLARY
Glucose-Capillary: 327 mg/dL — ABNORMAL HIGH (ref 70–99)
Glucose-Capillary: 280 mg/dL — ABNORMAL HIGH (ref 70–99)
Glucose-Capillary: 230 mg/dL — ABNORMAL HIGH (ref 70–99)

## 2023-06-18 LAB — I-STAT CREATININE, ED: Creatinine, Ser: 1.5 mg/dL — ABNORMAL HIGH (ref 0.44–1.00)

## 2023-06-18 LAB — ETHANOL: Alcohol, Ethyl (B): <10 mg/dL (ref <10)

## 2023-06-18 LAB — CBG MONITORING, ED: Glucose-Capillary: 431 mg/dL — ABNORMAL HIGH (ref 70–99)

## 2023-06-18 LAB — APTT: aPTT: 34 s (ref 24–36)

## 2023-06-18 MED ORDER — HYDROMORPHONE HCL 1 MG/ML IJ SOLN
0.5000 mg | INTRAMUSCULAR | Status: DC | PRN
  Administered 2023-06-18: 0.5 mg via INTRAVENOUS
  Filled 2023-06-18: qty 0.5

## 2023-06-18 MED ORDER — LABETALOL HCL 5 MG/ML IV SOLN: 10.0000 mg | INTRAVENOUS | Status: DC | PRN

## 2023-06-18 MED ORDER — ASPIRIN 81 MG PO TBEC
81.0000 mg | DELAYED_RELEASE_TABLET | Freq: Every day | ORAL | Status: DC
  Administered 2023-06-19: 81 mg via ORAL
  Filled 2023-06-18: qty 1

## 2023-06-18 MED ORDER — IOHEXOL 350 MG/ML SOLN
100.0000 mL | Freq: Once | INTRAVENOUS | Status: AC | PRN
  Administered 2023-06-18: 75 mL via INTRAVENOUS

## 2023-06-18 MED ORDER — ONDANSETRON HCL 4 MG/2ML IJ SOLN
4.0000 mg | Freq: Four times a day (QID) | INTRAMUSCULAR | Status: DC | PRN
  Administered 2023-06-18: 4 mg via INTRAVENOUS
  Filled 2023-06-18: qty 2

## 2023-06-18 MED ORDER — INSULIN ASPART 100 UNIT/ML IJ SOLN
4.0000 [IU] | Freq: Three times a day (TID) | INTRAMUSCULAR | Status: DC
  Administered 2023-06-18: 4 [IU] via SUBCUTANEOUS
  Filled 2023-06-18: qty 1

## 2023-06-18 MED ORDER — INSULIN ASPART 100 UNIT/ML IJ SOLN
0.0000 [IU] | Freq: Every day | INTRAMUSCULAR | Status: DC
  Administered 2023-06-18: 2 [IU] via SUBCUTANEOUS
  Filled 2023-06-18: qty 1

## 2023-06-18 MED ORDER — CLOPIDOGREL BISULFATE 75 MG PO TABS
300.0000 mg | ORAL_TABLET | Freq: Once | ORAL | Status: AC
  Administered 2023-06-18: 300 mg via ORAL
  Filled 2023-06-18: qty 4

## 2023-06-18 MED ORDER — STROKE: EARLY STAGES OF RECOVERY BOOK: Freq: Once | Status: DC

## 2023-06-18 MED ORDER — INSULIN DETEMIR 100 UNIT/ML ~~LOC~~ SOLN
0.1000 [IU]/kg | Freq: Every day | SUBCUTANEOUS | Status: DC
  Administered 2023-06-18 – 2023-06-19 (×2): 10 [IU] via SUBCUTANEOUS
  Filled 2023-06-18 (×2): qty 0.1

## 2023-06-18 MED ORDER — INSULIN ASPART 100 UNIT/ML IJ SOLN
0.0000 [IU] | Freq: Three times a day (TID) | INTRAMUSCULAR | Status: DC
  Administered 2023-06-18: 8 [IU] via SUBCUTANEOUS
  Administered 2023-06-19: 2 [IU] via SUBCUTANEOUS
  Administered 2023-06-19: 5 [IU] via SUBCUTANEOUS
  Filled 2023-06-18 (×3): qty 1

## 2023-06-18 MED ORDER — SODIUM CHLORIDE 0.9 % IV BOLUS
1000.0000 mL | Freq: Once | INTRAVENOUS | Status: AC
  Administered 2023-06-18: 1000 mL via INTRAVENOUS

## 2023-06-18 MED ORDER — CLOPIDOGREL BISULFATE 75 MG PO TABS
75.0000 mg | ORAL_TABLET | Freq: Every day | ORAL | Status: DC
  Administered 2023-06-19: 75 mg via ORAL
  Filled 2023-06-18: qty 1

## 2023-06-18 MED ORDER — SODIUM CHLORIDE 0.9 % IV SOLN: INTRAVENOUS | Status: DC

## 2023-06-18 MED ORDER — ASPIRIN 325 MG PO TABS
325.0000 mg | ORAL_TABLET | Freq: Once | ORAL | Status: AC
Start: 2023-06-18 — End: 2023-06-18
  Administered 2023-06-18: 325 mg via ORAL
  Filled 2023-06-18: qty 1

## 2023-06-18 MED ORDER — LORAZEPAM 2 MG/ML IJ SOLN
2.0000 mg | Freq: Once | INTRAMUSCULAR | Status: AC
  Administered 2023-06-18: 2 mg via INTRAVENOUS
  Filled 2023-06-18: qty 1

## 2023-06-18 NOTE — H&P (Addendum)
History and Physical    Patient: Michelle [name] MVH:[id number] DOB: [birth year] DOA: 06/18/2023 DOS: the patient was seen and examined on 06/18/2023 PCP: [facility]  Patient coming from: Home  Chief Complaint:  Chief Complaint  Patient presents with   Code Stroke   HPI: Michelle [name] is a 67 y.o. female with medical history significant of obesity, medication noncompliance, asthma, type 2 diabetes, hypertension, OSA on CPAP presenting with CVA.  Patient reports left-sided weakness for approximately 1 week.  Had acute decompensation over the course of the night.  Last well-known was around 8 PM last night.  No fevers or chills.  No chest pain or shortness of breath.  Positive slurred speech as well as mild confusion.  No reported headache or vision changes. Baseline type 2 diabetes.  Sugars have been very poorly controlled at home.  Noted to be in the 400s.  Blood pressures in the 150s at home per patient.  On baby aspirin.  Denies any known prior history of CVAs or strokes in the past. Presented to the ER afebrile, blood pressures 160s to 200s over 80s to 100s.  Satting well on room air.  White count 8, hemoglobin 11.7, platelets 344, creatinine 1.5, glucose 453.  Code stroke called.  CT head within normal limits.  CT angio of the head neck without any LVO.  Neurology with Dr. Iver Nestle evaluated the bedside.  Plan for stroke evaluation.  DAPT protocol initiated. Review of Systems: As mentioned in the history of present illness. All other systems reviewed and are negative. Past Medical History:  Diagnosis Date   Asthma    Depression 2015   Diabetes type 2, uncontrolled 2015   Hypertension    Past Surgical History:  Procedure Laterality Date   ABDOMINAL HYSTERECTOMY  1998   Social History:  reports that she quit smoking about 9 years ago. Her smoking use included cigarettes. She has never used smokeless tobacco. She reports that she does not drink  alcohol and does not use drugs.  Allergies  Allergen Reactions   Ace Inhibitors Other (See Comments)    Other reaction(s): Unknown Itchy eyes mouth and skin, possible throat swelling     Ciprofloxacin Itching   Morphine And Codeine Hives   Penicillins Hives   Tirzepatide Other (See Comments)    nausea, diarrhea, abdominal bloating    Family History  Problem Relation Age of Onset   Hypertension Mother    Heart disease Mother    Kidney disease Father    Heart disease Father    Hypertension Father     Prior to Admission medications   Medication Sig Start Date End Date Taking? Authorizing Provider  albuterol (PROVENTIL HFA;VENTOLIN HFA) 108 (90 BASE) MCG/ACT inhaler Inhale 2 puffs into the lungs every 4 (four) hours as needed for wheezing or shortness of breath. 12/08/14   Defelice, Para March, NP  albuterol (VENTOLIN HFA) 108 (90 Base) MCG/ACT inhaler Inhale 2 puffs into the lungs every 4 (four) hours as needed. 03/07/23   Becky Augusta, NP  amLODipine (NORVASC) 10 MG tablet Take 1 tablet by mouth daily. 10/27/20 10/27/21  [provider]  aspirin 81 MG EC tablet Take 1 tablet by mouth daily. 02/29/20   [provider]  atorvastatin (LIPITOR) 40 MG tablet Take by mouth. 10/21/18   [provider]  benzonatate (TESSALON) 100 MG capsule Take 2 capsules (200 mg total) by mouth every 8 (eight) hours. 03/07/23   Becky Augusta, NP  budesonide-formoterol [facility]) 160-4.5  MCG/ACT inhaler Inhale 2 puffs into the lungs 1 day or 1 dose. 01/22/20   Elson Areas, PA-C  carvedilol (COREG) 25 MG tablet Take by mouth. 02/20/19 02/20/20  [provider]  celecoxib (CELEBREX) 100 MG capsule Take 100 mg by mouth 2 (two) times daily. 08/14/21   [provider]  cetirizine (ZYRTEC) 10 MG tablet Take 1 tablet by mouth daily. 11/30/20 11/30/21  [provider]  chlorthalidone (HYGROTON) 25 MG tablet Take by mouth. 10/19/19   [provider]  dapagliflozin  propanediol (FARXIGA) 5 MG TABS tablet Take by mouth. 10/21/18   [provider]  doxycycline (VIBRAMYCIN) 100 MG capsule Take 1 capsule (100 mg total) by mouth 2 (two) times daily. 03/07/23   Becky Augusta, NP  DULoxetine (CYMBALTA) 60 MG capsule Take by mouth. 09/17/21   [provider]  fluticasone (FLONASE) 50 MCG/ACT nasal spray Place into the nose. 05/08/20   [provider]  furosemide (LASIX) 20 MG tablet Take 1 tablet (20 mg total) by mouth daily for 7 days. 06/15/22 06/22/22  Valinda Hoar, NP  gabapentin (NEURONTIN) 300 MG capsule Take 300 mg by mouth 3 (three) times daily. 08/14/21   [provider]  hydrALAZINE (APRESOLINE) 50 MG tablet Take by mouth. 10/27/19   [provider]  hydrOXYzine (ATARAX) 50 MG tablet Take by mouth. 02/19/21   [provider]  insulin aspart (NOVOLOG) 100 UNIT/ML injection 5u TID [facility] 03/31/20   [provider]  insulin glargine (LANTUS) 100 UNIT/ML injection Inject 75 Units into the skin at bedtime.    [provider]  ipratropium (ATROVENT) 0.06 % nasal spray Place 2 sprays into both nostrils 4 (four) times daily. 03/07/23   Becky Augusta, NP  losartan (COZAAR) 100 MG tablet Take 1 tablet by mouth daily. 07/17/21   [provider]  metFORMIN (GLUCOPHAGE) 500 MG tablet Take by mouth. 10/27/20 10/27/21  [provider]  OZEMPIC, 0.25 OR 0.5 MG/DOSE, 2 MG/3ML SOPN Inject into the skin.    [provider]  pantoprazole (PROTONIX) 40 MG tablet Take 40 mg by mouth daily. 07/12/21   [provider]  pregabalin (LYRICA) 100 MG capsule Take by mouth. 10/27/19   [provider]  promethazine-dextromethorphan (PROMETHAZINE-DM) 6.25-15 MG/5ML syrup Take 5 mLs by mouth 4 (four) times daily as needed. 03/07/23   Becky Augusta, NP  rosuvastatin (CRESTOR) 20 MG tablet Take 1 tablet by mouth daily. 02/01/22   [provider]  spironolactone (ALDACTONE) 25 MG tablet  Take 1 tablet by mouth daily. 07/17/21   [provider]  traMADol (ULTRAM) 50 MG tablet Take 1 tablet (50 mg total) by mouth every 6 (six) hours as needed. 06/15/22   Valinda Hoar, NP  traZODone (DESYREL) 50 MG tablet Take 1-3 tablets each night AS NEEDED for sleep. 03/19/19   [provider]  ziprasidone (GEODON) 60 MG capsule Take by mouth. 10/19/19   [provider]  fexofenadine (ALLEGRA) 60 MG tablet Take 1 tablet (60 mg total) by mouth 2 (two) times daily. 08/07/16 01/22/20  Lutricia Feil, PA-C  lisinopril (PRINIVIL,ZESTRIL) 10 MG tablet Take 10 mg by mouth daily.  01/22/20  [provider]    Physical Exam: Vitals:   06/18/23 1000 06/18/23 1030 06/18/23 1130 06/18/23 1200  BP: (!) 170/81 (!) 178/84 (!) 205/104 (!) 198/104  Pulse: 80 74 80 78  Resp: 17 12 (!) 21 16  Temp:      TempSrc:  SpO2: 100% 100% 98% 100%  Weight:      Height:       Physical Exam Constitutional:      Appearance: She is obese.  HENT:     Head: Normocephalic.     Nose: Nose normal.     Mouth/Throat:     Mouth: Mucous membranes are moist.  Eyes:     Pupils: Pupils are equal, round, and reactive to light.  Cardiovascular:     Rate and Rhythm: Normal rate and regular rhythm.  Pulmonary:     Effort: Pulmonary effort is normal.  Abdominal:     General: Bowel sounds are normal.  Musculoskeletal:     Comments: + L upper and lower extremity weakness    Skin:    General: Skin is warm.  Neurological:     Comments: + mild L sided facial droop and L sided weakness    Psychiatric:        Mood and Affect: Mood normal.     Data Reviewed:  There are no new results to review at this time.  CT ANGIO HEAD NECK W WO CM (CODE STROKE) CLINICAL DATA:  Left-sided weakness and numbness with left facial droop.  EXAM: CT ANGIOGRAPHY HEAD AND NECK WITH AND WITHOUT CONTRAST  TECHNIQUE: Multidetector CT imaging of the head and neck was performed using the standard  protocol during bolus administration of intravenous contrast. Multiplanar CT image reconstructions and MIPs were obtained to evaluate the vascular anatomy. Carotid stenosis measurements (when applicable) are obtained utilizing NASCET criteria, using the distal internal carotid diameter as the denominator.  RADIATION DOSE REDUCTION: This exam was performed according to the departmental dose-optimization program which includes automated exposure control, adjustment of the mA and/or kV according to patient size and/or use of iterative reconstruction technique.  CONTRAST:  75mL OMNIPAQUE IOHEXOL 350 MG/ML SOLN  COMPARISON:  Head CT from earlier today.  CTA of the head 05/31/2016  FINDINGS: CTA NECK FINDINGS  Aortic arch: Aortic and proximal descending wall thickening eccentrically present left lateral but unchanged from prior and thus considered atheromatous.  Right carotid system: Low-density atheromatous wall thickening. No stenosis, beading, or dissection.  Left carotid system: Mainly low-density atheromatous wall thickening with mild calcified plaque at the proximal ICA. No stenosis, ulceration, or beading.  Vertebral arteries: No proximal subclavian or vertebral stenosis, beading, or dissection.  Skeleton: Generalized degenerative disc narrowing and ridging. No acute fracture.  Other neck: Chronic right maxillary sinusitis.  No acute finding.  Upper chest: Subpleural nodule in the right upper lobe measuring up to 6 mm, chronic when compared to prior with stability consistent with benign lesion.  Review of the MIP images confirms the above findings  CTA HEAD FINDINGS  Anterior circulation: Atheromatous plaque along the cavernous carotids with significant stenosis seen on both sides, at least 50% at the left cavernous segment and over 50% at the right cavernous segment and paraclinoid level. Diffuse branch irregularity that is presumably atheromatous. Narrowings have  progressed, including at the right MCA bifurcation where there is at least moderate narrowing when compared to prior. Negative for aneurysm or vascular malformation.  Posterior circulation: Diffuse irregularity of the basilar and posterior cerebral arteries, presumed atherosclerosis. High-grade narrowing at the bilateral P2 segment. No major branch occlusion. No segmental beading or aneurysm.  Venous sinuses: Diffusely patent  Anatomic variants: None significant  Review of the MIP images confirms the above findings  IMPRESSION: 1. No emergent finding. 2. Advanced intracranial atherosclerosis with progression from 2017. Flow  reducing stenosis seen at the bilateral intracranial ICA, bilateral P2 segment, and likely at the right MCA bifurcation. 3. No significant stenosis of major arteries in the neck.  Electronically Signed   By: Tiburcio Pea M.D.   On: 06/18/2023 09:57 CT HEAD CODE STROKE WO CONTRAST CLINICAL DATA:  Code stroke. Neuro deficit, acute, stroke suspected. Left-sided weakness, numbness and facial droop.  EXAM: CT HEAD WITHOUT CONTRAST  TECHNIQUE: Contiguous axial images were obtained from the base of the skull through the vertex without intravenous contrast.  RADIATION DOSE REDUCTION: This exam was performed according to the departmental dose-optimization program which includes automated exposure control, adjustment of the mA and/or kV according to patient size and/or use of iterative reconstruction technique.  COMPARISON:  CT head and CTA head/neck 05/31/2016.  FINDINGS: Brain: No acute intracranial hemorrhage. Gray-white differentiation is preserved. No hydrocephalus or extra-axial collection. No mass effect or midline shift.  Vascular: No hyperdense vessel or unexpected calcification.  Skull: No calvarial fracture or suspicious bone lesion. Skull base is unremarkable.  Sinuses/Orbits: Chronic right maxillary sinusitis. Mild mucosal disease  throughout the remaining paranasal sinuses. Orbits are unremarkable.  Other: None.  ASPECTS ([place] Stroke Program Early CT Score)  - Ganglionic level infarction (caudate, lentiform nuclei, internal capsule, insula, M1-M3 cortex): 7  - Supraganglionic infarction (M4-M6 cortex): 3  Total score (0-10 with 10 being normal): 10  IMPRESSION: No acute intracranial hemorrhage or evidence of acute large vessel territory infarct. ASPECT score is 10.  Code stroke imaging results were communicated on 06/18/2023 at 9:32 am to provider Dr. Iver Nestle Via secure text paging.  Electronically Signed   By: Orvan Falconer M.D.   On: 06/18/2023 09:33  Lab Results  Component Value Date   WBC 7.9 06/18/2023   HGB 11.7 (L) 06/18/2023   HCT 37.5 06/18/2023   MCV 89.5 06/18/2023   PLT 344 06/18/2023   Last metabolic panel Lab Results  Component Value Date   GLUCOSE 453 (H) 06/18/2023   NA 129 (L) 06/18/2023   K 3.7 06/18/2023   CL 97 (L) 06/18/2023   CO2 25 06/18/2023   BUN 22 06/18/2023   CREATININE 1.50 (H) 06/18/2023   GFRNONAA 35 (L) 06/18/2023   CALCIUM 8.4 (L) 06/18/2023   PROT 7.0 06/18/2023   ALBUMIN 3.2 (L) 06/18/2023   BILITOT 0.3 06/18/2023   ALKPHOS 67 06/18/2023   AST 15 06/18/2023   ALT 12 06/18/2023   ANIONGAP 7 06/18/2023    Assessment and Plan: * CVA (cerebral vascular accident) (HCC) Positive worsening left-sided weakness as well as left-sided facial droop and dysarthria Symptoms present for roughly 7 days per patient (predominantly L sided weakness)  CT head WNL  CTA head and neck with negative for LVO  s/p formal evaluation by neurology in the ER  We will continue with formal CVA evaluation including MRI of the brain, 2D echo, restratification labs DAPT protocol PT OT evaluation Monitor   Type 2 diabetes mellitus with complication, with long-term current use of insulin (HCC) Blood sugar in 450s on presentation with patient reporting no longer taking  insulin No overt ketoacidosis at present Sliding-scale insulin Low-dose long-acting A1c pending Monitor  OSA (obstructive sleep apnea) CPAP   Hyperlipidemia Statin    Essential hypertension Allow for permissive hypertension in the setting of CVA evaluation As needed IV labetalol for systolic pressure greater than 220 or diastolic pressure greater than 110      Advance Care Planning:   Code Status: Full Code   Consults:  Neurology   Family Communication: Family at the bedside   Severity of Illness: The appropriate patient status for this patient is OBSERVATION. Observation status is judged to be reasonable and necessary in order to provide the required intensity of service to ensure the patient's safety. The patient's presenting symptoms, physical exam findings, and initial radiographic and laboratory data in the context of their medical condition is felt to place them at decreased risk for further clinical deterioration. Furthermore, it is anticipated that the patient will be medically stable for discharge from the hospital within 2 midnights of admission.   Author: Floydene Flock, MD 06/18/2023 12:19 PM  For on call review [email or web address].

## 2023-06-18 NOTE — Assessment & Plan Note (Addendum)
Positive worsening left-sided weakness as well as left-sided facial droop and dysarthria Symptoms present for roughly 7 days per patient (predominantly L sided weakness)  CT head WNL  CTA head and neck with negative for LVO  s/p formal evaluation by neurology in the ER  We will continue with formal CVA evaluation including MRI of the brain, 2D echo, restratification labs DAPT protocol PT OT evaluation Monitor

## 2023-06-18 NOTE — Progress Notes (Addendum)
Code Stroke cart activated @ 0915.  Pt visualized in CT @ 0918 along with Dr. Iver Nestle.  Extended case.  Dismissed by [facility] and off camera @ [phone].  Josiah Lobo BSN, Occupational hygienist

## 2023-06-18 NOTE — Assessment & Plan Note (Signed)
?   Statin.

## 2023-06-18 NOTE — Assessment & Plan Note (Signed)
Blood sugar in 450s on presentation with patient reporting no longer taking insulin No overt ketoacidosis at present Sliding-scale insulin Low-dose long-acting A1c pending Monitor

## 2023-06-18 NOTE — Assessment & Plan Note (Signed)
CPAP.  

## 2023-06-18 NOTE — Progress Notes (Addendum)
CODE STROKE- PHARMACY COMMUNICATION  Time CODE STROKE called/page received: 0906  Time response to CODE STROKE was made (in person or via phone): [phone]  Time Stroke Kit retrieved from [employer] (only if needed): 0906; already had from earlier code stroke; TNK not given   Name of Provider/Nurse contacted: N/A  Past Medical History:  Diagnosis Date   Asthma    Depression 2015   Diabetes type 2, uncontrolled 2015   Hypertension    Prior to Admission medications   Medication Sig Start Date End Date Taking? Authorizing Provider  albuterol (PROVENTIL HFA;VENTOLIN HFA) 108 (90 BASE) MCG/ACT inhaler Inhale 2 puffs into the lungs every 4 (four) hours as needed for wheezing or shortness of breath. 12/08/14   Defelice, Para March, NP  albuterol (VENTOLIN HFA) 108 (90 Base) MCG/ACT inhaler Inhale 2 puffs into the lungs every 4 (four) hours as needed. 03/07/23   Becky Augusta, NP  amLODipine (NORVASC) 10 MG tablet Take 1 tablet by mouth daily. 10/27/20 10/27/21  [provider]  aspirin 81 MG EC tablet Take 1 tablet by mouth daily. 02/29/20   [provider]  atorvastatin (LIPITOR) 40 MG tablet Take by mouth. 10/21/18   [provider]  benzonatate (TESSALON) 100 MG capsule Take 2 capsules (200 mg total) by mouth every 8 (eight) hours. 03/07/23   Becky Augusta, NP  budesonide-formoterol [facility]) 160-4.5 MCG/ACT inhaler Inhale 2 puffs into the lungs 1 day or 1 dose. 01/22/20   Elson Areas, PA-C  carvedilol (COREG) 25 MG tablet Take by mouth. 02/20/19 02/20/20  [provider]  celecoxib (CELEBREX) 100 MG capsule Take 100 mg by mouth 2 (two) times daily. 08/14/21   [provider]  cetirizine (ZYRTEC) 10 MG tablet Take 1 tablet by mouth daily. 11/30/20 11/30/21  [provider]  chlorthalidone (HYGROTON) 25 MG tablet Take by mouth. 10/19/19   [provider]  dapagliflozin propanediol (FARXIGA) 5 MG TABS tablet Take by mouth. 10/21/18   [provider]  doxycycline (VIBRAMYCIN) 100 MG capsule Take 1 capsule (100 mg total) by mouth 2 (two) times daily. 03/07/23   Becky Augusta, NP  DULoxetine (CYMBALTA) 60 MG capsule Take by mouth. 09/17/21   [provider]  fluticasone (FLONASE) 50 MCG/ACT nasal spray Place into the nose. 05/08/20   [provider]  furosemide (LASIX) 20 MG tablet Take 1 tablet (20 mg total) by mouth daily for 7 days. 06/15/22 06/22/22  Valinda Hoar, NP  gabapentin (NEURONTIN) 300 MG capsule Take 300 mg by mouth 3 (three) times daily. 08/14/21   [provider]  hydrALAZINE (APRESOLINE) 50 MG tablet Take by mouth. 10/27/19   [provider]  hydrOXYzine (ATARAX) 50 MG tablet Take by mouth. 02/19/21   [provider]  insulin aspart (NOVOLOG) 100 UNIT/ML injection 5u TID [facility] 03/31/20   [provider]  insulin glargine (LANTUS) 100 UNIT/ML injection Inject 75 Units into the skin at bedtime.    [provider]  ipratropium (ATROVENT) 0.06 % nasal spray Place 2 sprays into both nostrils 4 (four) times daily. 03/07/23   Becky Augusta, NP  losartan (COZAAR) 100 MG tablet Take 1 tablet by mouth daily. 07/17/21   [provider]  metFORMIN (GLUCOPHAGE) 500 MG tablet Take by mouth. 10/27/20 10/27/21  [provider]  OZEMPIC, 0.25 OR 0.5 MG/DOSE, 2 MG/3ML SOPN Inject into the skin.    [provider]  pantoprazole (PROTONIX) 40 MG tablet Take 40 mg by mouth daily. 07/12/21  [provider]  pregabalin (LYRICA) 100 MG capsule Take by mouth. 10/27/19   [provider]  promethazine-dextromethorphan (PROMETHAZINE-DM) 6.25-15 MG/5ML syrup Take 5 mLs by mouth 4 (four) times daily as needed. 03/07/23   Becky Augusta, NP  rosuvastatin (CRESTOR) 20 MG tablet Take 1 tablet by mouth daily. 02/01/22   [provider]  spironolactone (ALDACTONE) 25 MG tablet Take 1 tablet by mouth daily. 07/17/21   [provider]  traMADol (ULTRAM) 50  MG tablet Take 1 tablet (50 mg total) by mouth every 6 (six) hours as needed. 06/15/22   Valinda Hoar, NP  traZODone (DESYREL) 50 MG tablet Take 1-3 tablets each night AS NEEDED for sleep. 03/19/19   [provider]  ziprasidone (GEODON) 60 MG capsule Take by mouth. 10/19/19   [provider]  fexofenadine (ALLEGRA) 60 MG tablet Take 1 tablet (60 mg total) by mouth 2 (two) times daily. 08/07/16 01/22/20  Lutricia Feil, PA-C  lisinopril (PRINIVIL,ZESTRIL) 10 MG tablet Take 10 mg by mouth daily.  01/22/20  [provider]   Littie Deeds, PharmD Pharmacy Resident  06/18/2023 9:33 AM

## 2023-06-18 NOTE — ED Triage Notes (Signed)
Pt taken to CT by float RN accompanied by stroke coordinator and neurologist. Pt was last seen normal last night at 8pm by grandkids. Woke up with L sided weakness and numbness and L facial droop. EMS got LVO 2 on their stroke screen. CBG is 432. EMS BP was 175/82.

## 2023-06-18 NOTE — Progress Notes (Signed)
OT Cancellation Note  Patient Details Name: Kaiyla France MRN: [id number] DOB: 28-Sep-1955   Cancelled Treatment:    Reason Eval/Treat Not Completed: Patient at procedure or test/ unavailable. Chart reviewed. Eval initiated however RN in to initiate MRI transport. Deferred full assessment until later date as pt available.   Kathie Dike, M.S. OTR/L  06/18/23, 3:57 PM  ascom [phone]

## 2023-06-18 NOTE — Progress Notes (Signed)
   06/18/23 1200  Spiritual Encounters  Type of Visit Initial  Care provided to: Pt and family  Referral source Code page  Reason for visit Urgent spiritual support  OnCall Visit Yes  Spiritual Framework  Presenting Themes Meaning/purpose/sources of inspiration;Courage hope and growth  Patient Stress Factors Major life changes  Family Stress Factors Major life changes  Interventions  Spiritual Care Interventions Made Established relationship of care and support;Compassionate presence;Reflective listening;Prayer  Spiritual Care Plan  Spiritual Care Issues Still Outstanding No further spiritual care needs at this time (see row info)   Prayer with patient and her family. Patient is an [place] and have a very strong faith in the [place]. Patient has the support of her family and church community. Let family know that we are here for them 24/7.

## 2023-06-18 NOTE — ED Notes (Signed)
Family at bedside, pt in room, neurologist at bedside talking with pt and family.

## 2023-06-18 NOTE — Progress Notes (Signed)
   06/18/23 1400  Spiritual Encounters  Type of Visit Follow up  Care provided to: Patient  Referral source Nurse (RN/NT/LPN)  Reason for visit Advance directives  OnCall Visit Yes  Interventions  Spiritual Care Interventions Made Established relationship of care and support  Spiritual Care Plan  Spiritual Care Issues Still Outstanding No further spiritual care needs at this time (see row info)   Patient and family did not request an Advance Directive and saw no need in completing one. I did offer the education of an Advance directive.

## 2023-06-18 NOTE — Progress Notes (Signed)
SLP Cancellation Note  Patient Details Name: Michelle [name] MRN: [id number] DOB: 10/18/1955   Cancelled treatment:       Reason Eval/Treat Not Completed: Patient at procedure or test/unavailable (chart reviewed; pt out of room)  ST services will f/u w/ pt tomorrow w/ assessment.    Jerilynn Som, MS, CCC-SLP Speech Language Pathologist Rehab Services; [facility] [phone] (ascom) Rylah Fukuda 06/18/2023, 4:15 PM

## 2023-06-18 NOTE — ED Notes (Signed)
0930 NIH was done in CT by neurologist/full VS were not obtained at that time.

## 2023-06-18 NOTE — ED Provider Notes (Signed)
[facility] Provider Note    Event Date/Time   First MD Initiated Contact with Patient 06/18/23 0919     (approximate)   History   Code Stroke   HPI  Michelle [name] is a 67 y.o. female  who presents to the emergency department today as a code stroke. The patient states that last night she noticed some dizziness and left sided weakness. This was accompanied by a left sided headache. When she woke up this morning she continued to have the headache and weakness.        Physical Exam   Triage Vital Signs: ED Triage Vitals  Encounter Vitals Group     BP 06/18/23 0942 (!) 169/88     Systolic BP Percentile --      Diastolic BP Percentile --      Pulse Rate 06/18/23 0940 80     Resp 06/18/23 0940 13     Temp 06/18/23 0940 97.9 F (36.6 C)     Temp Source 06/18/23 0940 Oral     SpO2 --      Weight 06/18/23 0939 226 lb (102.5 kg)     Height 06/18/23 0939 5\' 3"  (1.6 m)     Head Circumference --      Peak Flow --      Pain Score --      Pain Loc --      Pain Education --      Exclude from Growth Chart --     Most recent vital signs: Vitals:   06/18/23 0940 06/18/23 0942  BP:  (!) 169/88  Pulse: 80   Resp: 13   Temp: 97.9 F (36.6 C)    General: Awake, alert, oriented. CV:  Good peripheral perfusion. Regular rate and rhythm. Resp:  Normal effort. Lungs clear. Abd:  No distention.    ED Results / Procedures / Treatments   Labs (all labs ordered are listed, but only abnormal results are displayed) Labs Reviewed  CBC - Abnormal; Notable for the following components:      Result Value   Hemoglobin 11.7 (*)    All other components within normal limits  COMPREHENSIVE METABOLIC PANEL - Abnormal; Notable for the following components:   Sodium 129 (*)    Chloride 97 (*)    Glucose, Bld 453 (*)    Creatinine, Ser 1.58 (*)    Calcium 8.4 (*)    Albumin 3.2 (*)    GFR, Estimated 35 (*)    All other components within normal limits   CBG MONITORING, ED - Abnormal; Notable for the following components:   Glucose-Capillary 431 (*)    All other components within normal limits  I-STAT CREATININE, ED - Abnormal; Notable for the following components:   Creatinine, Ser 1.50 (*)    All other components within normal limits  PROTIME-INR  APTT  DIFFERENTIAL  URINE DRUG SCREEN, QUALITATIVE ([facility] ONLY)  URINALYSIS, ROUTINE W REFLEX MICROSCOPIC  ETHANOL     EKG  I, Phineas Semen, attending physician, personally viewed and interpreted this EKG  EKG Time: 0941 Rate: 81 Rhythm: sinus rhythm Axis: normal Intervals: qtc 452 QRS: narrow, q waves v1 ST changes: no st elevation Impression: abnormal ekg   RADIOLOGY I independently interpreted and visualized the CT head. My interpretation: No bleed Radiology interpretation:  IMPRESSION:  No acute intracranial hemorrhage or evidence of acute large vessel  territory infarct. ASPECT score is 10.     PROCEDURES:  Critical Care performed: Yes  CRITICAL CARE Performed by: Phineas Semen   Total critical care time: *** minutes  Critical care time was exclusive of separately billable procedures and treating other patients.  Critical care was necessary to treat or prevent imminent or life-threatening deterioration.  Critical care was time spent personally by me on the following activities: development of treatment plan with patient and/or surrogate as well as nursing, discussions with consultants, evaluation of patient's response to treatment, examination of patient, obtaining history from patient or surrogate, ordering and performing treatments and interventions, ordering and review of laboratory studies, ordering and review of radiographic studies, pulse oximetry and re-evaluation of patient's condition.   Procedures    MEDICATIONS ORDERED IN ED: Medications  iohexol (OMNIPAQUE) 350 MG/ML injection 100 mL (75 mLs Intravenous Contrast Given 06/18/23 0927)      IMPRESSION / MDM / ASSESSMENT AND PLAN / ED COURSE  I reviewed the triage vital signs and the nursing notes.                              Differential diagnosis includes, but is not limited to, ***  Patient's presentation is most consistent with {EM COPA:27473}   ***The patient is on the cardiac monitor to evaluate for evidence of arrhythmia and/or significant heart rate changes.  ***      FINAL CLINICAL IMPRESSION(S) / ED DIAGNOSES   Final diagnoses:  None     Rx / DC Orders   ED Discharge Orders     None        Note:  This document was prepared using Dragon voice recognition software and may include unintentional dictation errors.

## 2023-06-18 NOTE — Code Documentation (Signed)
Stroke Response Nurse Documentation Code Documentation  Doloros Igoe is a 67 y.o. female arriving to [facility] via [facility] on 06/18/2023 with past medical hx of DM, HTN. On aspirin 81 mg daily. Code stroke was activated by EMS.   Patient from home where she was LKW at 2000 before bed last night (11/11) and now complaining of left facial droop and left sided weakness. Per EMS, grand kids who live with her saw her normal last night but  noticed she was different this morning with left facial droop and slurred speech. LVO score 2 by EMS.   Stroke team at the bedside on patient arrival. Labs drawn and patient cleared for CT by Dr. Roxan Hockey. Patient to CT with team. NIHSS 9, see documentation for details and code stroke times. Patient with left facial droop, bilateral arm weakness, bilateral leg weakness, left decreased sensation, and dysarthria  on exam. The following imaging was completed:  CT Head and CTA. Patient is not a candidate for IV Thrombolytic due to outside window, per MD. Patient is not a candidate for IR due to no LVo on imaging, per MD.   Care Plan: every 2 hour NIHSS and vital signs. Swallow Screen per order.   Bedside handoff with ED RN Barbee Cough.    Wille Glaser  Stroke Response RN

## 2023-06-18 NOTE — Consult Note (Addendum)
NEUROLOGY CONSULT NOTE   Date of service: June 18, 2023 Patient Name: Michelle [name] MRN:  [id number] DOB:  July 08, 1956 Chief Complaint: "Left-sided weakness" Requesting Provider: Phineas Semen, MD  History of Present Illness  Jahaida Dishaw is a 67 y.o. female  has a past medical history of Asthma, Depression (2015), Diabetes type 2, uncontrolled (2015), and Hypertension.  BMI 40, obstructive sleep apnea, medication inconsistent adherence, hyperlipidemia who presents with acute left-sided weakness  She reports she went to bed in her usual state of health last night at 8 PM.  On awakening this morning she noticed she was weak on the left side and her family convinced her to activate EMS for further evaluation.  On review of systems, denies any other focal neurological symptoms recently but does report 1 week of chills/sweats without any localizing signs or symptoms of infection.  She has had a yeast infection for which she is using a cream.  She reports adherence to her medications and is aware her A1c goal is less than 7% but endorses "eating things I should not" and notes she is sedentary.  She reports she usually is adherent with her CPAP machine but it has been leaking with new parts expected tomorrow.  She notes that she did not put aspirin in her pillbox last time she filled it and therefore her last dose of aspirin was 2 days ago.  She reports she has some myalgias she attributes to statin medication but she is adherent with it despite the myalgias  LKW: 8 PM on 11/11 Modified rankin score: 0 to 1 secondary to pain IV Thrombolysis: No, out of the window EVT: No, no LVO    ROS  Comprehensive ROS performed and pertinent positives documented in HPI    Past History   Past Medical History:  Diagnosis Date   Asthma    Depression 2015   Diabetes type 2, uncontrolled 2015   Hypertension     Past Surgical History:  Procedure Laterality Date   ABDOMINAL  HYSTERECTOMY  1998    Family History: Family History  Problem Relation Age of Onset   Hypertension Mother    Heart disease Mother    Kidney disease Father    Heart disease Father    Hypertension Father     Social History  reports that she quit smoking about 9 years ago. Her smoking use included cigarettes. She has never used smokeless tobacco. She reports that she does not drink alcohol and does not use drugs.  Allergies  Allergen Reactions   Ace Inhibitors Other (See Comments)    Other reaction(s): Unknown Itchy eyes mouth and skin, possible throat swelling     Ciprofloxacin Itching   Morphine And Codeine Hives   Penicillins Hives   Tirzepatide Other (See Comments)    nausea, diarrhea, abdominal bloating    Medications  No current facility-administered medications for this encounter.  Current Outpatient Medications:    albuterol (PROVENTIL HFA;VENTOLIN HFA) 108 (90 BASE) MCG/ACT inhaler, Inhale 2 puffs into the lungs every 4 (four) hours as needed for wheezing or shortness of breath., Disp: 1 Inhaler, Rfl: 0   albuterol (VENTOLIN HFA) 108 (90 Base) MCG/ACT inhaler, Inhale 2 puffs into the lungs every 4 (four) hours as needed., Disp: 18 g, Rfl: 0   amLODipine (NORVASC) 10 MG tablet, Take 1 tablet by mouth daily., Disp: , Rfl:    aspirin 81 MG EC tablet, Take 1 tablet by mouth daily., Disp: , Rfl:    atorvastatin (  LIPITOR) 40 MG tablet, Take by mouth., Disp: , Rfl:    benzonatate (TESSALON) 100 MG capsule, Take 2 capsules (200 mg total) by mouth every 8 (eight) hours., Disp: 21 capsule, Rfl: 0   budesonide-formoterol (SYMBICORT) 160-4.5 MCG/ACT inhaler, Inhale 2 puffs into the lungs 1 day or 1 dose., Disp: 1 Inhaler, Rfl: 0   carvedilol (COREG) 25 MG tablet, Take by mouth., Disp: , Rfl:    celecoxib (CELEBREX) 100 MG capsule, Take 100 mg by mouth 2 (two) times daily., Disp: , Rfl:    cetirizine (ZYRTEC) 10 MG tablet, Take 1 tablet by mouth daily., Disp: , Rfl:     chlorthalidone (HYGROTON) 25 MG tablet, Take by mouth., Disp: , Rfl:    dapagliflozin propanediol (FARXIGA) 5 MG TABS tablet, Take by mouth., Disp: , Rfl:    doxycycline (VIBRAMYCIN) 100 MG capsule, Take 1 capsule (100 mg total) by mouth 2 (two) times daily., Disp: 20 capsule, Rfl: 0   DULoxetine (CYMBALTA) 60 MG capsule, Take by mouth., Disp: , Rfl:    fluticasone (FLONASE) 50 MCG/ACT nasal spray, Place into the nose., Disp: , Rfl:    furosemide (LASIX) 20 MG tablet, Take 1 tablet (20 mg total) by mouth daily for 7 days., Disp: 7 tablet, Rfl: 0   gabapentin (NEURONTIN) 300 MG capsule, Take 300 mg by mouth 3 (three) times daily., Disp: , Rfl:    hydrALAZINE (APRESOLINE) 50 MG tablet, Take by mouth., Disp: , Rfl:    hydrOXYzine (ATARAX) 50 MG tablet, Take by mouth., Disp: , Rfl:    insulin aspart (NOVOLOG) 100 UNIT/ML injection, 5u TID AC, Disp: , Rfl:    insulin glargine (LANTUS) 100 UNIT/ML injection, Inject 75 Units into the skin at bedtime., Disp: , Rfl:    ipratropium (ATROVENT) 0.06 % nasal spray, Place 2 sprays into both nostrils 4 (four) times daily., Disp: 15 mL, Rfl: 12   losartan (COZAAR) 100 MG tablet, Take 1 tablet by mouth daily., Disp: , Rfl:    metFORMIN (GLUCOPHAGE) 500 MG tablet, Take by mouth., Disp: , Rfl:    OZEMPIC, 0.25 OR 0.5 MG/DOSE, 2 MG/3ML SOPN, Inject into the skin., Disp: , Rfl:    pantoprazole (PROTONIX) 40 MG tablet, Take 40 mg by mouth daily., Disp: , Rfl:    pregabalin (LYRICA) 100 MG capsule, Take by mouth., Disp: , Rfl:    promethazine-dextromethorphan (PROMETHAZINE-DM) 6.25-15 MG/5ML syrup, Take 5 mLs by mouth 4 (four) times daily as needed., Disp: 118 mL, Rfl: 0   rosuvastatin (CRESTOR) 20 MG tablet, Take 1 tablet by mouth daily., Disp: , Rfl:    spironolactone (ALDACTONE) 25 MG tablet, Take 1 tablet by mouth daily., Disp: , Rfl:    traMADol (ULTRAM) 50 MG tablet, Take 1 tablet (50 mg total) by mouth every 6 (six) hours as needed., Disp: 15 tablet, Rfl: 0    traZODone (DESYREL) 50 MG tablet, Take 1-3 tablets each night AS NEEDED for sleep., Disp: , Rfl:    ziprasidone (GEODON) 60 MG capsule, Take by mouth., Disp: , Rfl:   Vitals   Vitals:   06/18/23 0939 06/18/23 0940 06/18/23 0942  BP:   (!) 169/88  Pulse:  80   Resp:  13   Temp:  97.9 F (36.6 C)   TempSrc:  Oral   Weight: 102.5 kg    Height: 5\' 3"  (1.6 m)      Body mass index is 40.03 kg/m.  Physical Exam   Constitutional: Appears well-developed and well-nourished.  Psych: Affect  somewhat flat but pleasant and cooperative Eyes: No scleral injection.  HENT: No OP obstruction.  Head: Normocephalic. Cardiovascular: Normal rate and regular rhythm.  Respiratory: Effort normal, non-labored breathing.  GI: Soft.  No distension. There is no tenderness.  Skin: Warm dry and intact visible skin  Neurologic Examination   Mental status: Awake, alert, fully oriented to person, place, situation and time.  Gives a clear history, no aphasia, no neglect Cranial nerves: Visual fields full to confrontation, EOMI, reduced sensation in the left face, hearing intact to voice, tongue midline Motor: 5/5 throughout the bilateral upper extremities except for 4/5 left finger extension and 4-/5 left finger flexion; 4/5 right finger flexion  In the lower extremities pain limited hip flexion bilaterally but not fully antigravity on the left, 4/5 on the right.  Otherwise 5/5 on the right.  Knee flexion 4- on the left and knee extension 4+ on the left Sensory: Reports an inability to feel on the left face arm and leg Reflexes: Diminished throughout Coordination: Finger-to-nose intact bilaterally.  Heel-to-shin pain limited, toe to finger intact bilaterally within limits of weakness Gait: Deferred  Labs/Imaging/Neurodiagnostic studies   CBC:  Recent Labs  Lab Jun 22, 2023 0918  WBC 7.9  NEUTROABS 3.7  HGB 11.7*  HCT 37.5  MCV 89.5  PLT 344    Basic Metabolic Panel:  Lab Results  Component Value  Date   NA 129 (L) 06-22-23   K 3.7 06/22/23   CO2 25 06-22-23   GLUCOSE 453 (H) 06-22-2023   BUN 22 06-22-23   CREATININE 1.50 (H) June 22, 2023   CALCIUM 8.4 (L) June 22, 2023   GFRNONAA 35 (L) June 22, 2023   GFRAA >60 05/31/2016    Lipid Panel:  Lab Results  Component Value Date   LDLCALC 91 03/13/2012    HgbA1c:  Lab Results  Component Value Date   HGBA1C 6.9 (H) 03/13/2012    Urine Drug Screen: No results found for: "LABOPIA", "COCAINSCRNUR", "LABBENZ", "AMPHETMU", "THCU", "LABBARB"   Alcohol Level No results found for: "ETH"  INR  Lab Results  Component Value Date   INR 1.1 2023-06-22    APTT  Lab Results  Component Value Date   APTT 34 June 22, 2023    AED levels: No results found for: "PHENYTOIN", "ZONISAMIDE", "LAMOTRIGINE", "LEVETIRACETA"    CT Head without contrast(Personally reviewed): No acute intracranial hemorrhage or evidence of acute large vessel territory infarct. ASPECT score is 10. [On my review possible right internal capsule age-indeterminate hypodensity versus chronic microvascular disease]  CT angio Head and Neck with contrast(Personally reviewed): 1. No emergent finding. 2. Advanced intracranial atherosclerosis with progression from 2017. Flow reducing stenosis seen at the bilateral intracranial ICA, bilateral P2 segment, and likely at the right MCA bifurcation. 3. No significant stenosis of major arteries in the neck.    Impression   Derhonda Mccamish is a 67 y.o. female presenting with significant left-sided sensory loss and mild left-sided weakness, clinically concerning for small vessel stroke based on examination and history.  We discussed that her symptoms may fluctuate over the next 1 to 2 days and that clinically I feel that she has a stroke although MRI will be helpful in confirming this diagnosis.  Plan for full stroke workup as below  Recommendations  # Stroke determined by clinical assessment, suspect right sided  lacunar stroke leading to left-sided symptoms - Stroke labs HgbA1c, fasting lipid panel - MRI brain  - Frequent neuro checks - Echocardiogram - Prophylactic therapy-Antiplatelet med: Aspirin - dose 325mg  PO or 300mg   PR, followed by 81 mg daily - Plavix 300 mg load with 75 mg daily for 21 - 90 day course (course to be determined based on MRI location of stroke; likely 90 days given her significant intracranial atherosclerotic disease) - Risk factor modification, counseling on diet, exercise, CPAP and medication adherence as well as weight loss reviewed for 5-10 min at bedside today - Telemetry monitoring - Blood pressure goal   - Permissive hypertension to 220/120 due to acute stroke - PT consult, OT consult, Speech consult, unless patient is back to baseline - Consider addition of coenzyme Q 10 to ameliorate statin induced myalgia - I will follow-up workup above  ______________________________________________________________________    Signed,  Shakera Ebrahimi L Ardean Melroy [facility]  CRITICAL CARE Performed by: Gordy Councilman   Total critical care time: 45 minutes  Critical care time was exclusive of separately billable procedures and treating other patients.  Critical care was necessary to treat or prevent imminent or life-threatening deterioration -- emergent evaluation for consideration of thrombectomy/thrombolytic  Critical care was time spent personally by me on the following activities: development of treatment plan with patient and/or surrogate as well as nursing, discussions with consultants, evaluation of patient's response to treatment, examination of patient, obtaining history from patient or surrogate, ordering and performing treatments and interventions, ordering and review of laboratory studies, ordering and review of radiographic studies, pulse oximetry and re-evaluation of patient's condition.

## 2023-06-18 NOTE — Plan of Care (Signed)
  Problem: Education: Goal: Knowledge of disease or condition will improve Outcome: Progressing Goal: Knowledge of secondary prevention will improve (MUST DOCUMENT ALL) Outcome: Progressing Goal: Knowledge of patient specific risk factors will improve Loraine Leriche N/A or DELETE if not current risk factor) Outcome: Progressing   Problem: Ischemic Stroke/TIA Tissue Perfusion: Goal: Complications of ischemic stroke/TIA will be minimized Outcome: Progressing   Problem: Coping: Goal: Will verbalize positive feelings about self Outcome: Progressing Goal: Will identify appropriate support needs Outcome: Progressing   Problem: Health Behavior/Discharge Planning: Goal: Ability to manage health-related needs will improve Outcome: Progressing Goal: Goals will be collaboratively established with patient/family Outcome: Progressing   Problem: Self-Care: Goal: Ability to participate in self-care as condition permits will improve Outcome: Progressing Goal: Verbalization of feelings and concerns over difficulty with self-care will improve Outcome: Progressing Goal: Ability to communicate needs accurately will improve Outcome: Progressing   Problem: Nutrition: Goal: Risk of aspiration will decrease Outcome: Progressing Goal: Dietary intake will improve Outcome: Progressing   Problem: Education: Goal: Ability to describe self-care measures that may prevent or decrease complications (Diabetes Survival Skills Education) will improve Outcome: Progressing Goal: Individualized Educational Video(s) Outcome: Progressing   Problem: Coping: Goal: Ability to adjust to condition or change in health will improve Outcome: Progressing   Problem: Fluid Volume: Goal: Ability to maintain a balanced intake and output will improve Outcome: Progressing

## 2023-06-18 NOTE — Assessment & Plan Note (Signed)
Allow for permissive hypertension in the setting of CVA evaluation As needed IV labetalol for systolic pressure greater than 220 or diastolic pressure greater than 110

## 2023-06-19 ENCOUNTER — Observation Stay: Payer: 59

## 2023-06-19 DIAGNOSIS — R531 Weakness: Secondary | ICD-10-CM | POA: Diagnosis not present

## 2023-06-19 DIAGNOSIS — Z794 Long term (current) use of insulin: Secondary | ICD-10-CM

## 2023-06-19 DIAGNOSIS — E118 Type 2 diabetes mellitus with unspecified complications: Secondary | ICD-10-CM

## 2023-06-19 DIAGNOSIS — I10 Essential (primary) hypertension: Secondary | ICD-10-CM | POA: Diagnosis not present

## 2023-06-19 DIAGNOSIS — E785 Hyperlipidemia, unspecified: Secondary | ICD-10-CM | POA: Diagnosis not present

## 2023-06-19 DIAGNOSIS — G4733 Obstructive sleep apnea (adult) (pediatric): Secondary | ICD-10-CM | POA: Diagnosis not present

## 2023-06-19 DIAGNOSIS — I639 Cerebral infarction, unspecified: Secondary | ICD-10-CM | POA: Diagnosis not present

## 2023-06-19 LAB — LIPID PANEL
Cholesterol: 135 mg/dL (ref 0–200)
HDL: 35 mg/dL — ABNORMAL LOW (ref >40)
LDL Cholesterol: 75 mg/dL (ref 0–99)
Total CHOL/HDL Ratio: 3.9 {ratio}
Triglycerides: 127 mg/dL (ref <150)
VLDL: 25 mg/dL (ref 0–40)

## 2023-06-19 LAB — GLUCOSE, CAPILLARY
Glucose-Capillary: 212 mg/dL — ABNORMAL HIGH (ref 70–99)
Glucose-Capillary: 132 mg/dL — ABNORMAL HIGH (ref 70–99)
Glucose-Capillary: 203 mg/dL — ABNORMAL HIGH (ref 70–99)

## 2023-06-19 MED ORDER — INSULIN ASPART 100 UNIT/ML IJ SOLN: 6.0000 [IU] | Freq: Three times a day (TID) | INTRAMUSCULAR | Status: DC

## 2023-06-19 MED ORDER — IRBESARTAN 150 MG PO TABS
300.0000 mg | ORAL_TABLET | Freq: Every day | ORAL | Status: DC
  Administered 2023-06-19: 300 mg via ORAL
  Filled 2023-06-19: qty 2

## 2023-06-19 MED ORDER — LORAZEPAM 2 MG/ML IJ SOLN: 2.0000 mg | Freq: Once | INTRAMUSCULAR | Status: DC

## 2023-06-19 MED ORDER — LORAZEPAM 2 MG/ML IJ SOLN
2.0000 mg | Freq: Once | INTRAMUSCULAR | Status: AC | PRN
  Administered 2023-06-19: 2 mg via INTRAVENOUS
  Filled 2023-06-19: qty 1

## 2023-06-19 MED ORDER — ROSUVASTATIN CALCIUM 20 MG PO TABS
20.0000 mg | ORAL_TABLET | Freq: Every day | ORAL | Status: DC
  Administered 2023-06-19: 20 mg via ORAL
  Filled 2023-06-19: qty 1

## 2023-06-19 MED ORDER — FLUTICASONE FUROATE-VILANTEROL 200-25 MCG/ACT IN AEPB
1.0000 | INHALATION_SPRAY | Freq: Every day | RESPIRATORY_TRACT | Status: DC
  Administered 2023-06-19: 1 via RESPIRATORY_TRACT
  Filled 2023-06-19: qty 28

## 2023-06-19 MED ORDER — CLOPIDOGREL BISULFATE 75 MG PO TABS: 75.0000 mg | ORAL_TABLET | Freq: Every day | ORAL | 0 refills | Status: AC

## 2023-06-19 MED ORDER — INSULIN ASPART 100 UNIT/ML IJ SOLN
6.0000 [IU] | Freq: Three times a day (TID) | INTRAMUSCULAR | Status: DC
  Administered 2023-06-19 (×2): 6 [IU] via SUBCUTANEOUS
  Filled 2023-06-19 (×2): qty 1

## 2023-06-19 MED ORDER — ALBUTEROL SULFATE (2.5 MG/3ML) 0.083% IN NEBU: 2.5000 mg | INHALATION_SOLUTION | RESPIRATORY_TRACT | Status: DC | PRN

## 2023-06-19 MED ORDER — HYDROCHLOROTHIAZIDE 25 MG PO TABS
25.0000 mg | ORAL_TABLET | Freq: Every day | ORAL | Status: DC
Start: 2023-06-19 — End: 2023-06-19
  Administered 2023-06-19: 25 mg via ORAL
  Filled 2023-06-19: qty 1

## 2023-06-19 MED ORDER — PANTOPRAZOLE SODIUM 40 MG PO TBEC
40.0000 mg | DELAYED_RELEASE_TABLET | Freq: Every day | ORAL | Status: DC
  Administered 2023-06-19: 40 mg via ORAL
  Filled 2023-06-19: qty 1

## 2023-06-19 MED ORDER — OLMESARTAN MEDOXOMIL-HCTZ 40-25 MG PO TABS: 1.0000 | ORAL_TABLET | Freq: Every day | ORAL | Status: DC

## 2023-06-19 MED ORDER — DAPAGLIFLOZIN PROPANEDIOL 10 MG PO TABS
10.0000 mg | ORAL_TABLET | Freq: Every day | ORAL | Status: DC
  Administered 2023-06-19: 10 mg via ORAL
  Filled 2023-06-19: qty 1

## 2023-06-19 NOTE — Inpatient Diabetes Management (Signed)
Received call back from [place], RN at [facility] ENDO office  RN told me pt saw the Diabetes Educator on 11/26/2022  Per DM Educator report, pt stated she was taking the following at home: Tresiba 40 units at HS Novolog 25 units BID with Breakfast and Dinner (not eating Lunch) Farxiga 10 mg daily Metformin 1000 mg BID Mounjaro 5 mg Qweek Had not been checking CBGs at that visit and Stopped using her Dexcom G7 Sensor b/c she lost the Receiver The DM Educator downloaded and set up the Dexcom App on her phone at that visit Pt was asked by the Educator to Include Novolog Correction scale when she takes her Novolog meal coverage (Novolog 2:50 >150)     --Will follow patient during hospitalization--  Ambrose Finland RN, MSN, CDCES Diabetes Coordinator Inpatient Glycemic Control Team Team Pager: [phone] (8a-5p)

## 2023-06-19 NOTE — Care Management Obs Status (Signed)
MEDICARE OBSERVATION STATUS NOTIFICATION   Patient Details  Name: Michelle [name] MRN: [id number] Date of Birth: 06/11/1956   Medicare Observation Status Notification Given:  Yes    Paraskevi Funez, LCSW 06/19/2023, 12:52 PM

## 2023-06-19 NOTE — Plan of Care (Signed)
  Problem: Education: Goal: Knowledge of disease or condition will improve Outcome: Progressing Goal: Knowledge of secondary prevention will improve (MUST DOCUMENT ALL) Outcome: Progressing Goal: Knowledge of patient specific risk factors will improve Loraine Leriche N/A or DELETE if not current risk factor) Outcome: Progressing   Problem: Ischemic Stroke/TIA Tissue Perfusion: Goal: Complications of ischemic stroke/TIA will be minimized Outcome: Progressing   Problem: Coping: Goal: Will verbalize positive feelings about self Outcome: Progressing Goal: Will identify appropriate support needs Outcome: Progressing   Problem: Health Behavior/Discharge Planning: Goal: Ability to manage health-related needs will improve Outcome: Progressing Goal: Goals will be collaboratively established with patient/family Outcome: Progressing   Problem: Self-Care: Goal: Ability to participate in self-care as condition permits will improve Outcome: Progressing Goal: Verbalization of feelings and concerns over difficulty with self-care will improve Outcome: Progressing Goal: Ability to communicate needs accurately will improve Outcome: Progressing   Problem: Nutrition: Goal: Risk of aspiration will decrease Outcome: Progressing Goal: Dietary intake will improve Outcome: Progressing   Problem: Education: Goal: Ability to describe self-care measures that may prevent or decrease complications (Diabetes Survival Skills Education) will improve Outcome: Progressing Goal: Individualized Educational Video(s) Outcome: Progressing   Problem: Coping: Goal: Ability to adjust to condition or change in health will improve Outcome: Progressing   Problem: Fluid Volume: Goal: Ability to maintain a balanced intake and output will improve Outcome: Progressing   Problem: Health Behavior/Discharge Planning: Goal: Ability to identify and utilize available resources and services will improve Outcome:  Progressing Goal: Ability to manage health-related needs will improve Outcome: Progressing   Problem: Metabolic: Goal: Ability to maintain appropriate glucose levels will improve Outcome: Progressing   Problem: Nutritional: Goal: Maintenance of adequate nutrition will improve Outcome: Progressing Goal: Progress toward achieving an optimal weight will improve Outcome: Progressing   Problem: Skin Integrity: Goal: Risk for impaired skin integrity will decrease Outcome: Progressing   Problem: Tissue Perfusion: Goal: Adequacy of tissue perfusion will improve Outcome: Progressing   Problem: Education: Goal: Knowledge of General Education information will improve Description: Including pain rating scale, medication(s)/side effects and non-pharmacologic comfort measures Outcome: Progressing   Problem: Health Behavior/Discharge Planning: Goal: Ability to manage health-related needs will improve Outcome: Progressing   Problem: Clinical Measurements: Goal: Ability to maintain clinical measurements within normal limits will improve Outcome: Progressing Goal: Will remain free from infection Outcome: Progressing Goal: Diagnostic test results will improve Outcome: Progressing Goal: Respiratory complications will improve Outcome: Progressing Goal: Cardiovascular complication will be avoided Outcome: Progressing   Problem: Activity: Goal: Risk for activity intolerance will decrease Outcome: Progressing   Problem: Nutrition: Goal: Adequate nutrition will be maintained Outcome: Progressing   Problem: Coping: Goal: Level of anxiety will decrease Outcome: Progressing   Problem: Elimination: Goal: Will not experience complications related to bowel motility Outcome: Progressing Goal: Will not experience complications related to urinary retention Outcome: Progressing   Problem: Pain Management: Goal: General experience of comfort will improve Outcome: Progressing   Problem:  Safety: Goal: Ability to remain free from injury will improve Outcome: Progressing   Problem: Skin Integrity: Goal: Risk for impaired skin integrity will decrease Outcome: Progressing

## 2023-06-19 NOTE — Progress Notes (Addendum)
SLP Cancellation Note  Patient Details Name: Michelle [name] MRN: [id number] DOB: 1956-07-18   Cancelled treatment:       Reason Eval/Treat Not Completed: SLP screened, no needs identified, will sign off (chart reviewed; consulted NSG and met w/ pt in room. Friends present.)   Per MRI: "No evidence of acute intracranial abnormality.".    Pt denied any difficulty swallowing and is currently on a regular diet; tolerates swallowing pills w/ water per NSG.  Pt conversed in full conversation w/out expressive/receptive deficits noted; pt denied any speech-language deficits. Speech clear, intelligible. Pt A/O x4 w/ no cognitive-communication deficits noted by OT at their session.   No further skilled ST services indicated as pt appears at her baseline. Pt agreed. NSG to reconsult if any change in status while admitted.      Jerilynn Som, MS, CCC-SLP Speech Language Pathologist Rehab Services; [facility] [phone] (ascom) Kirin Brandenburger 06/19/2023, 10:07 AM

## 2023-06-19 NOTE — Progress Notes (Addendum)
NEUROLOGY CONSULT FOLLOW UP NOTE   Date of service: June 19, 2023 Patient Name: Michelle [name] MRN:  [id number] DOB:  [birth year]  Brief HPI  Michelle [name] is a 67 y.o. female  has a past medical history of Asthma, Depression (2015), Diabetes type 2, uncontrolled (2015), and Hypertension. 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  Denies any bowel or bladder issues  Tolerated MRI brain well with 2 mg Ativan on board  Baseline MRI is likely to she does use a rollator as needed at home.  Baseline per OT and PT evaluations  Vitals   Vitals:   06/18/23 2014 06/19/23 0013 06/19/23 0413 06/19/23 0713  BP: 119/71 (!) 140/72 (!) 158/83 (!) 164/84  Pulse: 74 73 75 79  Resp: 18 18 18 19   Temp: 97.7 F (36.5 C) 98.5 F (36.9 C) 98.3 F (36.8 C) 97.7 F (36.5 C)  TempSrc: Oral Oral Oral Oral  SpO2: 100% 100% 100% 100%  Weight:      Height:         Body mass index is 40.03 kg/m.  Physical Exam   Constitutional: Appears well-developed and well-nourished.  Psych: Affect appropriate to situation.  Eyes: No scleral injection.  HENT: No OP obstruction. Head: Normocephalic.  Cardiovascular: Normal rate and regular rhythm.  Respiratory: Effort normal, non-labored breathing GI: Soft.  No distension. There is no tenderness.  Skin: WDI.   Neurologic Examination   Awake, alert, oriented to person, place, and situation and time, including stating that her MRI was negative for stroke.  However it does have some repetitive question asking,  Continues to have a mild upper motor neuron pattern of weakness on the left side as described yesterday.  Left facial  numbness is now noted only subtly in the left cheek, normal in left V1 and V3.  Reflexes are somewhat asymmetric with brisker reflexes on the left compared to the right (2+ on the left, 1+ to absent on the right)    Labs and Diagnostic Imaging   CBC:  Recent Labs  Lab 06/18/23 0918  WBC 7.9  NEUTROABS 3.7  HGB 11.7*  HCT 37.5  MCV 89.5  PLT 344    Basic Metabolic Panel:  Lab Results  Component Value Date   NA 129 (L) 06/18/2023   K 3.7 06/18/2023   CO2 25 06/18/2023   GLUCOSE 453 (H) 06/18/2023   BUN 22 06/18/2023   CREATININE 1.50 (H) 06/18/2023   CALCIUM 8.4 (L) 06/18/2023   GFRNONAA 35 (L) 06/18/2023   GFRAA >60 05/31/2016   Lipid Panel:  Lab Results  Component Value Date   LDLCALC 75 06/19/2023   HgbA1c:  Lab Results  Component Value Date   HGBA1C 12.4 (H) 06/18/2023   eAG 309  Urine Drug Screen:     Component Value Date/Time   LABOPIA NONE DETECTED 06/18/2023 1230   COCAINSCRNUR NONE DETECTED 06/18/2023 1230   LABBENZ NONE DETECTED 06/18/2023 1230   AMPHETMU NONE DETECTED 06/18/2023 1230   THCU NONE DETECTED 06/18/2023 1230   LABBARB NONE DETECTED 06/18/2023 1230    Alcohol Level     Component Value Date/Time   ETH <10 06/18/2023 0920  INR  Lab Results  Component Value Date   INR 1.1 06/18/2023   APTT  Lab Results  Component Value Date   APTT 34 06/18/2023    CT Head without contrast(Personally reviewed): no acute intracranial process   CT angio Head and Neck with contrast(Personally reviewed): 1. No emergent finding. 2. Advanced intracranial atherosclerosis with progression from 2017. Flow reducing stenosis seen at the bilateral intracranial ICA, bilateral P2 segment, and likely at the right MCA bifurcation. 3. No significant stenosis of major arteries in the neck.  MRI Brain(Personally reviewed): No evidence of acute intracranial abnormality.  [Mild chronic microvascular disease]  Impression   She does continue to have some  mild left-sided weakness.  Symptoms are less present in her face at this time but she is describing neck pain.  States her neuropathy has been worse in the left leg for many years but the weakness is new in the last few weeks.  Notably this is not consistent with the history she gave me yesterday and she does have some mild inattention/poor concentration on exam today.  However she does continue to have some left-sided weakness in an upper motor neuron pattern not well explained by imaging, as well as slightly increased reflexes on the left side.  I do think getting an MRI cervical spine in this setting is prudent  Recommendations  # TIA vs. Symptomatic hyperglycemia versus cervical myelopathy - MRI C-spine, Ativan 2 mg IV ordered again for sedation - HgbA1c  not meeting goal, eAg  of 309 reviewed with patient  - LDL near goal at 75, continue home atrovastatin 40 mg,  - Consider addition of coenzyme Q 10 to ameliorate statin induced myalgia (over-the-counter med not on hospital formulary); patient provided with written instructions to consider purchasing this - Echocardiogram pending, if there is left atrial enlargement please order zio patch for patient (can be completed outpatient if there is a significant delay in completing procedure) - Continue DAPT for 21 day course  - Risk factor modification, counseling on diet, exercise, CPAP and medication adherence completed again, with emphasis on need for better blood sugar control given this is her most significant risk factor at this time - Telemetry monitoring - Blood pressure goal -- goal gradual normotension - Neurology will follow-up MRI cervical spine and echocardiogram report.  If these are reassuring we will plan to sign off.  Discussed with primary team via secure chat.  Do not hesitate to reach out if additional questions or concerns arise  Addendum: MRI personally reviewed, agree with radiology report IMPRESSION: 1. No acute abnormality in  the cervical spine. 2. Multilevel degenerative changes of the cervical spine with mild spinal canal narrowing at C3-C4, C5-C6, and C6-C7. 3. Severe left and moderate right neural foraminal narrowing at C4-C5. ______________________________________________________________________   Thank you for the opportunity to take part in the care of this patient. If you have any further questions, please contact the neurology consultation team on call. Updated oncall schedule is listed on AMION.  Signed,  Aireana Ryland L Darlene Brozowski [facility] coverage for [facility] is from 8 AM to 4 AM in-house and 4 PM to 8 PM by telephone/video. 8 PM to 8 AM emergent questions or overnight urgent questions should be addressed to Teleneurology On-call or [facility] neurohospitalist; contact information can be found on AMION'

## 2023-06-19 NOTE — Inpatient Diabetes Management (Addendum)
Inpatient Diabetes Program Recommendations  AACE/ADA: New Consensus Statement on Inpatient Glycemic Control (2015)  Target Ranges:  Prepandial:   less than 140 mg/dL      Peak postprandial:   less than 180 mg/dL (1-2 hours)      Critically ill patients:  140 - 180 mg/dL    Latest Reference Range & Units 06/18/23 09:18  Hemoglobin A1C 4.8 - 5.6 % 12.4 (H)  309 mg/dl  (H): Data is abnormally high  Latest Reference Range & Units 06/18/23 09:15 06/18/23 13:04 06/18/23 17:50 06/18/23 19:48  Glucose-Capillary 70 - 99 mg/dL 952 (H) 841 (H)   10 units Levemir 280 (H)  12 units Novolog  230 (H)  2 units Novolog   (H): Data is abnormally high  Latest Reference Range & Units 06/19/23 07:23  Glucose-Capillary 70 - 99 mg/dL 324 (H)  11 units Novolog  10 units Levemir  (H): Data is abnormally high  To ED with Code Stroke  History: DM2  Home DM Meds: Lantus 75 units QPM       Novolog 5 units TID       Metformin  Current Orders: Novolog Moderate Correction Scale/ SSI (0-15 units) TID AC + HS     Novolog 6 units TID with meals     Levemir 10 units daily     Farxiga 10 mg daily    MD- Note Novolog 6 units TID with meals started this AM--Farxiga also started this AM  Please consider increasing the Levemir to 15 units Daily--AM CBG today was 212     ENDO: [facility]--Last seen 11/13/2022 Was told to: Stop Ozempic/ Continue Tresiba and Novolog/ Continue Farxiga and Metformin/ Take [employer] like PCP added Rybelsus 7 mg daily at 06/03/2023 appt    Addendum 12:10pm--Met w/ pt at bedside.  She was A&O and able to hold conversation with me.  I attempted to review her home DM meds, however, pt seemed to have a lot of confusion as to what meds she should be taking at home for her blood sugar control.  Was unsure if she is taking Lantus or Tresiba once daily and did not know the dose--Told me she is NOT taking Novolog with meals even though Novolog is listed in home meds and is listed  as a med in her last Endocrinology visit--She was unsure about if she should be taking Metformin--Was unsure if she should be taking Farxiga--Was unsure whether she should be taking Mounjaro or Ozempic weekly.  Pt did acknowledge the new Rybelsus 7 mg daily Rx that was started end of Oct.  Discussed with pt that I will cal her ENDO office and try to get clarification of her home DM meds.  Called the [facility] ENDO office and left VM with pt's ENDO's medical assistant requesting the above info and for call back.   Reviewed pt's current A1c with her and reviewed healthy CBG goals for home.  Pt has CBG meter but needs more strips and lancets for her meter.    Question if pt is taking her meds as prescribed at home as she seemed very confused as to what she should be taking.    --Will follow patient during hospitalization--  Ambrose Finland RN, MSN, CDCES Diabetes Coordinator Inpatient Glycemic Control Team Team Pager: [phone] (8a-5p)

## 2023-06-19 NOTE — Evaluation (Signed)
Physical Therapy Evaluation Patient Details Name: Michelle [name] MRN: [id number] DOB: Nov 20, 1955 Today's Date: 06/19/2023  History of Present Illness  Michelle [name] is a 68 y.o. female with medical history significant of obesity, medication noncompliance, asthma, type 2 diabetes, hypertension, OSA on CPAP presenting with L weakness ~1 week. MRI negative for acute stroke.  Clinical Impression  Patient received in recliner. She is pleasant and agreeable to PT assessment. She stands with supervision. Ambulated 125 feet without AD, ambulated up/down 4 steps x 2 with B rails. Requires increased time with mobility but generally supervision only. Fatigues easily. Patient appears to be and verbalized that she is near baseline. No further skilled PT needs at this time.        If plan is discharge home, recommend the following:  N/A   Can travel by private vehicle    yes    Equipment Recommendations None recommended by PT  Recommendations for Other Services    N/A   Functional Status Assessment Patient has not had a recent decline in their functional status     Precautions / Restrictions Precautions Precautions: None Restrictions Weight Bearing Restrictions: No      Mobility  Bed Mobility                    Transfers Overall transfer level: Needs assistance Equipment used: None Transfers: Sit to/from Stand Sit to Stand: Supervision                Ambulation/Gait Ambulation/Gait assistance: Supervision Gait Distance (Feet): 125 Feet Assistive device: None Gait Pattern/deviations: Step-through pattern, Decreased step length - right, Decreased step length - left, Decreased stride length Gait velocity: decr     General Gait Details: patient ambulated in hallway, no AD, supervision  Stairs Stairs: Yes Stairs assistance: Supervision Stair Management: Two rails, Step to pattern Number of Stairs: 8 General stair comments: generally safe on  steps with supervision  Wheelchair Mobility     Tilt Bed    Modified Rankin (Stroke Patients Only)       Balance Overall balance assessment: Mild deficits observed, not formally tested                                           Pertinent Vitals/Pain Pain Assessment Pain Assessment: No/denies pain    Home Living Family/patient expects to be discharged to:: Private residence Living Arrangements: Other relatives Available Help at Discharge: Family Type of Home: House Home Access: Stairs to enter   Secretary/administrator of Steps: 3 no rails, 5 with rail   Home Layout: One level Home Equipment: Rollator (4 wheels)      Prior Function Prior Level of Function : Independent/Modified Independent;Driving             Mobility Comments: rollator as needed, limited community moblity ADLs Comments: independent     Extremity/Trunk Assessment   Upper Extremity Assessment Upper Extremity Assessment: Defer to OT evaluation LUE Deficits / Details: 4/5 grossly    Lower Extremity Assessment Lower Extremity Assessment: Overall WFL for tasks assessed    Cervical / Trunk Assessment Cervical / Trunk Assessment: Normal  Communication   Communication Communication: No apparent difficulties Cueing Techniques: Verbal cues  Cognition Arousal: Alert Behavior During Therapy: WFL for tasks assessed/performed Overall Cognitive Status: Within Functional Limits for tasks assessed  General Comments General comments (skin integrity, edema, etc.): SpO2 99% on RA    Exercises     Assessment/Plan    PT Assessment Patient does not need any further PT services  PT Problem List         PT Treatment Interventions      PT Goals (Current goals can be found in the Care Plan section)  Acute Rehab PT Goals Patient Stated Goal: to return home PT Goal Formulation: With patient Time For Goal Achievement:  06/22/23 Potential to Achieve Goals: Good    Frequency       Co-evaluation               AM-PAC PT "6 Clicks" Mobility  Outcome Measure Help needed turning from your back to your side while in a flat bed without using bedrails?: None Help needed moving from lying on your back to sitting on the side of a flat bed without using bedrails?: None Help needed moving to and from a bed to a chair (including a wheelchair)?: None Help needed standing up from a chair using your arms (e.g., wheelchair or bedside chair)?: None Help needed to walk in hospital room?: None Help needed climbing 3-5 steps with a railing? : None 6 Click Score: 24    End of Session   Activity Tolerance: Patient tolerated treatment well Patient left: in chair;with call bell/phone within reach;with nursing/sitter in room Nurse Communication: Mobility status      Time: 1610-9604 PT Time Calculation (min) (ACUTE ONLY): 11 min   Charges:   PT Evaluation $PT Eval Low Complexity: 1 Low   PT General Charges $$ ACUTE PT VISIT: 1 Visit         Kmari Brian, PT, GCS 06/19/23,10:13 AM

## 2023-06-19 NOTE — Hospital Course (Addendum)
Taken from H&P.   Michelle [name] is a 67 y.o. female with medical history significant of obesity, medication noncompliance, asthma, type 2 diabetes, hypertension, OSA on CPAP presenting with CVA.  Patient reports left-sided weakness for approximately 1 week.  Had acute decompensation over the course of the night.   Presented to the ER afebrile, blood pressures 160s to 200s over 80s to 100s.  Satting well on room air.  White count 8, hemoglobin 11.7, platelets 344, creatinine 1.5, glucose 453.  Code stroke called.  CT head within normal limits.  CT angio of the head neck without any LVO.   Neurology was also consulted and patient was admitted for stroke workup.  11/13: Blood pressure elevated at 164/84, CBG 212, lipid panel mostly normal, HDL 35 and LDL of 75, UA with glucosuria and small amount of leukocytes,A1c of 12.4.  MRI brain negative for any acute intracranial abnormality, MR cervical spine was obtained to rule out cervical radiculopathy as she is having ongoing weakness for a while, it was negative for any acute abnormality, did show multilevel degenerative changes with mild spinal cord narrowing at C3-C4, C5-C6 and C6-C7.  Had a severe left and moderate right neural foraminal narrowing at C4-C5.  Diabetes education was provided as she has uncontrolled diabetes mellitus with hyperglycemia.  She was not following her dietary restrictions.  Blood pressure was also elevated with some recent changes made by PCP.  Patient was instructed to have a close follow-up with PCP for better control of diabetes and hypertension.  Unable to obtain echocardiogram to complete CVA workup due to scheduling issue.  Patient can get an echocardiogram as an outpatient.  She was given DAPT for 3 weeks, with aspirin and Plavix, she will continue with aspirin afterwards.  She will continue with her statin.  Patient will continue on current medications and need to have a close follow-up with her providers for  further management.

## 2023-06-19 NOTE — TOC CM/SW Note (Signed)
Transition of Care [facility]) - Inpatient Brief Assessment   Patient Details  Name: Michelle [name] MRN: [id number] Date of Birth: 07-27-1956  [facility] of [facility] CM/SW Contact:    Allena Katz, LCSW Phone Number: 06/19/2023, 10:05 AM   Clinical Narrative:    Transition of Care Asessment: Insurance and Status: Insurance coverage has been reviewed Patient has primary care physician: Yes Home environment has been reviewed: [id number] [place] [id number]- Prior level of function:: PT/OT have assessed and patient is independent with no needs. Prior/Current Home Services: No current home services Social Determinants of Health Reivew: SDOH reviewed no interventions necessary Readmission risk has been reviewed: Yes Transition of care needs: no transition of care needs at this time

## 2023-06-19 NOTE — Evaluation (Addendum)
Occupational Therapy Evaluation Patient Details Name: Michelle [name] MRN: [id number] DOB: Jun 12, 1956 Today's Date: 06/19/2023   History of Present Illness Michelle [name] is a 67 y.o. female with medical history significant of obesity, medication noncompliance, asthma, type 2 diabetes, hypertension, OSA on CPAP presenting with L weakness ~1 week. MRI negative for acute stroke.   Clinical Impression   Michelle [name] was seen for OT evaluation this date. Prior to hospital admission, pt was IND using rollator as needed. Pt lives in house with 3-5 STE and 2 grand daughters. Pt currently requires SUPERVISION for ADL t/f ~160 ft, use of L rail as needed. Reports 8/10 fatigue, SpO2 98% on RA. MOD I standing grooming and dressing tasks, increased time as pt reports fatigue. IND seated dressing tasks. Educated pt on rollator use for falls prevention and energy conservation, recommend BSC and educated pt on use. All education complete, will sign off. Upon hospital discharge, recommend no OT follow up.     If plan is discharge home, recommend the following: Help with stairs or ramp for entrance    Functional Status Assessment  Patient has not had a recent decline in their functional status  Equipment Recommendations  BSC/3in1    Recommendations for Other Services       Precautions / Restrictions Precautions Precautions: None Restrictions Weight Bearing Restrictions: No      Mobility Bed Mobility Overal bed mobility: Independent                  Transfers Overall transfer level: Needs assistance Equipment used: None Transfers: Sit to/from Stand Sit to Stand: Supervision                  Balance Overall balance assessment: Mild deficits observed, not formally tested                                         ADL either performed or assessed with clinical judgement   ADL Overall ADL's : Needs assistance/impaired                                        General ADL Comments: SUPERVISION for ADL t/f ~160 ft, use of L rail as needed. Reports 8/10 fatigue. MOD I standing grooming and dressing tasks, increased time as pt reports fatigue. IND seated dressing tasks.      Pertinent Vitals/Pain Pain Assessment Pain Assessment: No/denies pain     Extremity/Trunk Assessment Upper Extremity Assessment Upper Extremity Assessment: Right hand dominant;LUE deficits/detail LUE Deficits / Details: 4/5 grossly   Lower Extremity Assessment Lower Extremity Assessment: Overall WFL for tasks assessed       Communication Communication Communication: No apparent difficulties   Cognition Arousal: Alert Behavior During Therapy: WFL for tasks assessed/performed Overall Cognitive Status: Within Functional Limits for tasks assessed                                       General Comments  SpO2 99% on RA            Home Living Family/patient expects to be discharged to:: Private residence Living Arrangements: Other relatives (2 granddaughter 57 and 78 yo) Available Help at Discharge: Family Type of Home: House  Home Access: Stairs to enter Entrance Stairs-Number of Steps: 3 no rails, 5 with rail   Home Layout: One level         Firefighter: Standard     Home Equipment: Rollator (4 wheels)          Prior Functioning/Environment Prior Level of Function : Independent/Modified Independent;Driving             Mobility Comments: rollator as needed, limited community moblity          OT Problem List: Impaired balance (sitting and/or standing);Decreased activity tolerance         OT Goals(Current goals can be found in the care plan section) Acute Rehab OT Goals Patient Stated Goal: to go home OT Goal Formulation: With patient Time For Goal Achievement: 06/19/23 Potential to Achieve Goals: Good   AM-PAC OT "6 Clicks" Daily Activity     Outcome Measure Help from another person  eating meals?: None Help from another person taking care of personal grooming?: A Little Help from another person toileting, which includes using toliet, bedpan, or urinal?: A Little Help from another person bathing (including washing, rinsing, drying)?: A Little Help from another person to put on and taking off regular upper body clothing?: None Help from another person to put on and taking off regular lower body clothing?: None 6 Click Score: 21   End of Session    Activity Tolerance: Patient tolerated treatment well Patient left: in chair;with call bell/phone within reach  OT Visit Diagnosis: Unsteadiness on feet (R26.81)                Time: 7829-5621 OT Time Calculation (min): 17 min Charges:  OT General Charges $OT Visit: 1 Visit OT Evaluation $OT Eval Low Complexity: 1 Low  Kathie Dike, M.S. OTR/L  06/19/23, 9:23 AM  ascom [phone]

## 2023-06-19 NOTE — Discharge Summary (Signed)
Physician Discharge Summary   Patient: Michelle [name] MRN: [id number] DOB: 08/25/1955  Admit date:     06/18/2023  Discharge date: 06/19/23  Discharge Physician: Arnetha Courser   PCP: [facility]   Recommendations at discharge:  Please obtain CBC and BMP on follow-up Please arrange outpatient echocardiogram to complete the workup. Follow-up with primary care provider Patient need better control of diabetes and hypertension  Discharge Diagnoses: Principal Problem:   CVA (cerebral vascular accident) (HCC) Active Problems:   Essential hypertension   Hyperlipidemia   OSA (obstructive sleep apnea)   Type 2 diabetes mellitus with complication, with long-term current use of insulin [facility])   Hospital Course: Taken from H&P.   Michelle [name] is a 67 y.o. female with medical history significant of obesity, medication noncompliance, asthma, type 2 diabetes, hypertension, OSA on CPAP presenting with CVA.  Patient reports left-sided weakness for approximately 1 week.  Had acute decompensation over the course of the night.   Presented to the ER afebrile, blood pressures 160s to 200s over 80s to 100s.  Satting well on room air.  White count 8, hemoglobin 11.7, platelets 344, creatinine 1.5, glucose 453.  Code stroke called.  CT head within normal limits.  CT angio of the head neck without any LVO.   Neurology was also consulted and patient was admitted for stroke workup.  11/13: Blood pressure elevated at 164/84, CBG 212, lipid panel mostly normal, HDL 35 and LDL of 75, UA with glucosuria and small amount of leukocytes,A1c of 12.4.  MRI brain negative for any acute intracranial abnormality, MR cervical spine was obtained to rule out cervical radiculopathy as she is having ongoing weakness for a while, it was negative for any acute abnormality, did show multilevel degenerative changes with mild spinal cord narrowing at C3-C4, C5-C6 and C6-C7.  Had a severe  left and moderate right neural foraminal narrowing at C4-C5.  Diabetes education was provided as she has uncontrolled diabetes mellitus with hyperglycemia.  She was not following her dietary restrictions.  Blood pressure was also elevated with some recent changes made by PCP.  Patient was instructed to have a close follow-up with PCP for better control of diabetes and hypertension.  Unable to obtain echocardiogram to complete CVA workup due to scheduling issue.  Patient can get an echocardiogram as an outpatient.  She was given DAPT for 3 weeks, with aspirin and Plavix, she will continue with aspirin afterwards.  She will continue with her statin.  Patient will continue on current medications and need to have a close follow-up with her providers for further management.   Consultants: Neurology Procedures performed: None Disposition: Home Diet recommendation:  Discharge Diet Orders (From admission, onward)     Start     Ordered   06/19/23 0000  Diet - low sodium heart healthy        06/19/23 1606           Cardiac and Carb modified diet DISCHARGE MEDICATION: Allergies as of 06/19/2023       Reactions   Ace Inhibitors Other (See Comments)   Other reaction(s): Unknown Itchy eyes mouth and skin, possible throat swelling    Ciprofloxacin Itching   Morphine And Codeine Hives   Penicillins Hives   Tirzepatide Other (See Comments)   nausea, diarrhea, abdominal bloating        Medication List     STOP taking these medications    benzonatate 100 MG capsule Commonly known as: TESSALON  doxycycline 100 MG capsule Commonly known as: VIBRAMYCIN   promethazine-dextromethorphan 6.25-15 MG/5ML syrup Commonly known as: PROMETHAZINE-DM       TAKE these medications    albuterol 108 (90 Base) MCG/ACT inhaler Commonly known as: VENTOLIN HFA Inhale 2 puffs into the lungs every 4 (four) hours as needed for wheezing or shortness of breath. What changed: Another medication  with the same name was removed. Continue taking this medication, and follow the directions you see here.   amLODipine 10 MG tablet Commonly known as: NORVASC Take 1 tablet by mouth daily.   aspirin EC 81 MG tablet Take 1 tablet by mouth daily.   budesonide-formoterol 160-4.5 MCG/ACT inhaler Commonly known as: SYMBICORT Inhale 2 puffs into the lungs 1 day or 1 dose.   cetirizine 10 MG tablet Commonly known as: ZYRTEC Take 1 tablet by mouth daily.   clopidogrel 75 MG tablet Commonly known as: PLAVIX Take 1 tablet (75 mg total) by mouth daily for 21 days. Start taking on: June 20, 2023   DULoxetine 60 MG capsule Commonly known as: CYMBALTA Take by mouth.   Farxiga 10 MG Tabs tablet Generic drug: dapagliflozin propanediol Take 10 mg by mouth daily.   fluticasone 50 MCG/ACT nasal spray Commonly known as: FLONASE Place into the nose.   gabapentin 300 MG capsule Commonly known as: NEURONTIN Take 300 mg by mouth 3 (three) times daily.   hydrOXYzine 50 MG tablet Commonly known as: ATARAX Take 50 mg by mouth 3 (three) times daily.   insulin glargine 100 UNIT/ML injection Commonly known as: LANTUS Inject 10 Units into the skin at bedtime.   ipratropium 0.06 % nasal spray Commonly known as: ATROVENT Place 2 sprays into both nostrils 4 (four) times daily.   ketoconazole 2 % cream Commonly known as: NIZORAL Apply 1 Application topically daily.   olmesartan-hydrochlorothiazide 40-25 MG tablet Commonly known as: BENICAR HCT Take 1 tablet by mouth daily.   rosuvastatin 20 MG tablet Commonly known as: CRESTOR Take 1 tablet by mouth daily.   Rybelsus 3 MG Tabs Generic drug: Semaglutide Take 3 mg by mouth daily at 6 (six) AM.   traMADol 50 MG tablet Commonly known as: ULTRAM Take 1 tablet (50 mg total) by mouth every 6 (six) hours as needed.   traZODone 50 MG tablet Commonly known as: DESYREL Take 1-3 tablets each night AS NEEDED for sleep.         Follow-up Information     [facility] Follow up.   Why: Hospital follow up Contact information: [address] [place] [address] [phone]         Antonieta Iba, MD. Call.   Specialty: Cardiology Why: To get an echocardiogram to complete the workup Contact information: [address] [place] [address] [phone]                Discharge Exam: Ceasar Mons Weights   06/18/23 0939  Weight: 102.5 kg   General.  Obese lady, in no acute distress. Pulmonary.  Lungs clear bilaterally, normal respiratory effort. CV.  Regular rate and rhythm, no JVD, rub or murmur. Abdomen.  Soft, nontender, nondistended, BS positive. CNS.  Alert and oriented .  No focal neurologic deficit. Extremities.  No edema, no cyanosis, pulses intact and symmetrical. Psychiatry.  Judgment and insight appears normal.   Condition at discharge: stable  The results of significant diagnostics from this hospitalization (including imaging, microbiology, ancillary and laboratory) are listed below for reference.  Imaging Studies: MR CERVICAL SPINE WO CONTRAST  Result Date: 06/19/2023 CLINICAL DATA:  Myelopathy, acute, cervical spine 3 weeks of left sided weakness, increased left sided reflexes, left sided neck pain EXAM: MRI CERVICAL SPINE WITHOUT CONTRAST TECHNIQUE: Multiplanar, multisequence MR imaging of the cervical spine was performed. No intravenous contrast was administered. COMPARISON:  None Available. FINDINGS: Alignment: Physiologic. Vertebrae: No fracture, evidence of discitis, or bone lesion. T2 hyperintense signal within the C4, C5, C6, and C7 vertebral bodies is favored to be degenerative. Cord: Normal signal and morphology. Posterior Fossa, vertebral arteries, paraspinal tissues: Negative. Disc levels: C1-C2: Mild degenerative change. C2-C3: Moderate bilateral facet degenerative change. No significant disc bulge. No spinal canal narrowing. No neural  foraminal narrowing. C3-C4: Moderate bilateral facet degenerative change. No significant disc bulge. Mild spinal canal narrowing. Uncovertebral hypertrophy. Moderate bilateral neural foraminal narrowing. C4-C5: Minimal disc bulge. Moderate bilateral facet degenerative change. Uncovertebral hypertrophy. Severe left and moderate right neural foraminal narrowing. C5-C6: Circumferential disc bulge. Mild spinal canal narrowing. Moderate bilateral facet degenerative change. Mild bilateral neural foraminal narrowing. C6-C7: Moderate bilateral facet degenerative change. Circumferential disc bulge. Mild spinal canal narrowing. Mild bilateral neural foraminal narrowing. C7-T1: Moderate bilateral facet degenerative change. No spinal canal or neural foraminal narrowing. IMPRESSION: 1. No acute abnormality in the cervical spine. 2. Multilevel degenerative changes of the cervical spine with mild spinal canal narrowing at C3-C4, C5-C6, and C6-C7. 3. Severe left and moderate right neural foraminal narrowing at C4-C5. Electronically Signed   By: Lorenza Cambridge M.D.   On: 06/19/2023 14:09   MR BRAIN WO CONTRAST  Result Date: 06/18/2023 CLINICAL DATA:  Neuro deficit, acute, stroke suspected left sided weakness and numbness EXAM: MRI HEAD WITHOUT CONTRAST TECHNIQUE: Multiplanar, multiecho pulse sequences of the brain and surrounding structures were obtained without intravenous contrast. COMPARISON:  CT head June 18, 2023. FINDINGS: Brain: No acute infarction, hemorrhage, hydrocephalus, extra-axial collection or mass lesion. Vascular: Major arterial flow voids are maintained. Skull and upper cervical spine: Normal marrow signal. Sinuses/Orbits: Moderate paranasal sinus mucosal thickening. No acute orbital findings. Other: No mastoid effusions. IMPRESSION: No evidence of acute intracranial abnormality. Electronically Signed   By: Feliberto Harts M.D.   On: 06/18/2023 16:42   CT ANGIO HEAD NECK W WO CM (CODE STROKE)  Result  Date: 06/18/2023 CLINICAL DATA:  Left-sided weakness and numbness with left facial droop. EXAM: CT ANGIOGRAPHY HEAD AND NECK WITH AND WITHOUT CONTRAST TECHNIQUE: Multidetector CT imaging of the head and neck was performed using the standard protocol during bolus administration of intravenous contrast. Multiplanar CT image reconstructions and MIPs were obtained to evaluate the vascular anatomy. Carotid stenosis measurements (when applicable) are obtained utilizing NASCET criteria, using the distal internal carotid diameter as the denominator. RADIATION DOSE REDUCTION: This exam was performed according to the departmental dose-optimization program which includes automated exposure control, adjustment of the mA and/or kV according to patient size and/or use of iterative reconstruction technique. CONTRAST:  75mL OMNIPAQUE IOHEXOL 350 MG/ML SOLN COMPARISON:  Head CT from earlier today.  CTA of the head 05/31/2016 FINDINGS: CTA NECK FINDINGS Aortic arch: Aortic and proximal descending wall thickening eccentrically present left lateral but unchanged from prior and thus considered atheromatous. Right carotid system: Low-density atheromatous wall thickening. No stenosis, beading, or dissection. Left carotid system: Mainly low-density atheromatous wall thickening with mild calcified plaque at the proximal ICA. No stenosis, ulceration, or beading. Vertebral arteries: No proximal subclavian or vertebral stenosis, beading, or dissection. Skeleton: Generalized degenerative disc narrowing and ridging. No acute  fracture. Other neck: Chronic right maxillary sinusitis.  No acute finding. Upper chest: Subpleural nodule in the right upper lobe measuring up to 6 mm, chronic when compared to prior with stability consistent with benign lesion. Review of the MIP images confirms the above findings CTA HEAD FINDINGS Anterior circulation: Atheromatous plaque along the cavernous carotids with significant stenosis seen on both sides, at least  50% at the left cavernous segment and over 50% at the right cavernous segment and paraclinoid level. Diffuse branch irregularity that is presumably atheromatous. Narrowings have progressed, including at the right MCA bifurcation where there is at least moderate narrowing when compared to prior. Negative for aneurysm or vascular malformation. Posterior circulation: Diffuse irregularity of the basilar and posterior cerebral arteries, presumed atherosclerosis. High-grade narrowing at the bilateral P2 segment. No major branch occlusion. No segmental beading or aneurysm. Venous sinuses: Diffusely patent Anatomic variants: None significant Review of the MIP images confirms the above findings IMPRESSION: 1. No emergent finding. 2. Advanced intracranial atherosclerosis with progression from 2017. Flow reducing stenosis seen at the bilateral intracranial ICA, bilateral P2 segment, and likely at the right MCA bifurcation. 3. No significant stenosis of major arteries in the neck. Electronically Signed   By: Tiburcio Pea M.D.   On: 06/18/2023 09:57   CT HEAD CODE STROKE WO CONTRAST  Result Date: 06/18/2023 CLINICAL DATA:  Code stroke. Neuro deficit, acute, stroke suspected. Left-sided weakness, numbness and facial droop. EXAM: CT HEAD WITHOUT CONTRAST TECHNIQUE: Contiguous axial images were obtained from the base of the skull through the vertex without intravenous contrast. RADIATION DOSE REDUCTION: This exam was performed according to the departmental dose-optimization program which includes automated exposure control, adjustment of the mA and/or kV according to patient size and/or use of iterative reconstruction technique. COMPARISON:  CT head and CTA head/neck 05/31/2016. FINDINGS: Brain: No acute intracranial hemorrhage. Gray-white differentiation is preserved. No hydrocephalus or extra-axial collection. No mass effect or midline shift. Vascular: No hyperdense vessel or unexpected calcification. Skull: No calvarial  fracture or suspicious bone lesion. Skull base is unremarkable. Sinuses/Orbits: Chronic right maxillary sinusitis. Mild mucosal disease throughout the remaining paranasal sinuses. Orbits are unremarkable. Other: None. ASPECTS ([place] Stroke Program Early CT Score) - Ganglionic level infarction (caudate, lentiform nuclei, internal capsule, insula, M1-M3 cortex): 7 - Supraganglionic infarction (M4-M6 cortex): 3 Total score (0-10 with 10 being normal): 10 IMPRESSION: No acute intracranial hemorrhage or evidence of acute large vessel territory infarct. ASPECT score is 10. Code stroke imaging results were communicated on 06/18/2023 at 9:32 am to provider Dr. Iver Nestle Via secure text paging. Electronically Signed   By: Orvan Falconer M.D.   On: 06/18/2023 09:33    Microbiology: Results for orders placed or performed during the hospital encounter of 10/12/22  Resp panel by RT-PCR (RSV, Flu A&B, Covid) Anterior Nasal Swab     Status: None   Collection Time: 10/12/22  9:45 AM   Specimen: Anterior Nasal Swab  Result Value Ref Range Status   SARS Coronavirus 2 by RT PCR NEGATIVE NEGATIVE Final    Comment: (NOTE) SARS-CoV-2 target nucleic acids are NOT DETECTED.  The SARS-CoV-2 RNA is generally detectable in upper respiratory specimens during the acute phase of infection. The lowest concentration of SARS-CoV-2 viral copies this assay can detect is 138 copies/mL. A negative result does not preclude SARS-Cov-2 infection and should not be used as the sole basis for treatment or other patient management decisions. A negative result may occur with  improper specimen collection/handling, submission of specimen other  than nasopharyngeal swab, presence of viral mutation(s) within the areas targeted by this assay, and inadequate number of viral copies(<138 copies/mL). A negative result must be combined with clinical observations, patient history, and epidemiological information. The expected result is  Negative.  Fact Sheet for Patients:  BloggerCourse.com  Fact Sheet for Healthcare Providers:  SeriousBroker.it  This test is no t yet approved or cleared by the [place] FDA and  has been authorized for detection and/or diagnosis of SARS-CoV-2 by FDA under an Emergency Use Authorization (EUA). This EUA will remain  in effect (meaning this test can be used) for the duration of the COVID-19 declaration under Section 564(b)(1) of the Act, 21 U.S.C.section 360bbb-3(b)(1), unless the authorization is terminated  or revoked sooner.       Influenza A by PCR NEGATIVE NEGATIVE Final   Influenza B by PCR NEGATIVE NEGATIVE Final    Comment: (NOTE) The Xpert Xpress SARS-CoV-2/FLU/RSV plus assay is intended as an aid in the diagnosis of influenza from Nasopharyngeal swab specimens and should not be used as a sole basis for treatment. Nasal washings and aspirates are unacceptable for Xpert Xpress SARS-CoV-2/FLU/RSV testing.  Fact Sheet for Patients: BloggerCourse.com  Fact Sheet for Healthcare Providers: SeriousBroker.it  This test is not yet approved or cleared by the [place] FDA and has been authorized for detection and/or diagnosis of SARS-CoV-2 by FDA under an Emergency Use Authorization (EUA). This EUA will remain in effect (meaning this test can be used) for the duration of the COVID-19 declaration under Section 564(b)(1) of the Act, 21 U.S.C. section 360bbb-3(b)(1), unless the authorization is terminated or revoked.     Resp Syncytial Virus by PCR NEGATIVE NEGATIVE Final    Comment: (NOTE) Fact Sheet for Patients: BloggerCourse.com  Fact Sheet for Healthcare Providers: SeriousBroker.it  This test is not yet approved or cleared by the [place] FDA and has been authorized for detection and/or diagnosis of  SARS-CoV-2 by FDA under an Emergency Use Authorization (EUA). This EUA will remain in effect (meaning this test can be used) for the duration of the COVID-19 declaration under Section 564(b)(1) of the Act, 21 U.S.C. section 360bbb-3(b)(1), unless the authorization is terminated or revoked.  Performed at [facility], [address] [place], [place] [address]     Labs: CBC: Recent Labs  Lab 06/18/23 0918  WBC 7.9  NEUTROABS 3.7  HGB 11.7*  HCT 37.5  MCV 89.5  PLT 344   Basic Metabolic Panel: Recent Labs  Lab 06/18/23 0918 06/18/23 0930  NA 129*  --   K 3.7  --   CL 97*  --   CO2 25  --   GLUCOSE 453*  --   BUN 22  --   CREATININE 1.58* 1.50*  CALCIUM 8.4*  --    Liver Function Tests: Recent Labs  Lab 06/18/23 0918  AST 15  ALT 12  ALKPHOS 67  BILITOT 0.3  PROT 7.0  ALBUMIN 3.2*   CBG: Recent Labs  Lab 06/18/23 1750 06/18/23 1948 06/19/23 0723 06/19/23 1125 06/19/23 1601  GLUCAP 280* 230* 212* 132* 203*    Discharge time spent: greater than 30 minutes.  This record has been created using Conservation officer, historic buildings. Errors have been sought and corrected,but may not always be located. Such creation errors do not reflect on the standard of care.   Signed: Arnetha Courser, MD [facility] 06/19/2023

## 2023-06-19 NOTE — Care Management CC44 (Signed)
Condition Code 44 Documentation Completed  Patient Details  Name: Michelle [name] MRN: [id number] Date of Birth: 1956/06/12   Condition Code 44 given:  Yes Patient signature on Condition Code 44 notice:  Yes Documentation of 2 MD's agreement:  Yes Code 44 added to claim:  Yes    Allena Katz, LCSW 06/19/2023, 12:53 PM

## 2023-06-20 ENCOUNTER — Telehealth: Payer: Self-pay | Admitting: Emergency Medicine

## 2023-06-20 DIAGNOSIS — I639 Cerebral infarction, unspecified: Secondary | ICD-10-CM

## 2023-06-20 NOTE — Telephone Encounter (Signed)
The following message from Dr. Nelson Chimes.   Patient was admitted for concern of TIA, stroke on imaging, neurology wants to complete the workup which include echocardiogram, unfortunately echocardiogram cannot be done today after discussing with echo tech due to extra full schedule, is it possible for you to arrange outpatient echocardiogram and follow-up in your office, I cannot order outpatient testing like this   Per Dr. Mariah Milling order placed for echocardiogram.

## 2023-07-09 ENCOUNTER — Ambulatory Visit: Payer: 59 | Attending: Internal Medicine

## 2023-08-01 ENCOUNTER — Encounter: Payer: Self-pay | Admitting: Cardiovascular Disease

## 2023-09-03 ENCOUNTER — Ambulatory Visit: Payer: 59 | Attending: Cardiology | Admitting: Cardiology

## 2023-09-21 ENCOUNTER — Encounter: Payer: Self-pay | Admitting: Emergency Medicine

## 2023-09-21 ENCOUNTER — Ambulatory Visit
Admission: EM | Admit: 2023-09-21 | Discharge: 2023-09-21 | Disposition: A | Discharge status: Home / Self Care | Payer: 59 | Attending: Physician Assistant | Admitting: Physician Assistant

## 2023-09-21 DIAGNOSIS — N390 Urinary tract infection, site not specified: Secondary | ICD-10-CM | POA: Diagnosis present

## 2023-09-21 LAB — WET PREP, GENITAL
Clue Cells Wet Prep HPF POC: NONE SEEN
Sperm: NONE SEEN
Trich, Wet Prep: NONE SEEN
WBC, Wet Prep HPF POC: >10 — AB (ref <10)
Yeast Wet Prep HPF POC: NONE SEEN

## 2023-09-21 LAB — URINALYSIS, W/ REFLEX TO CULTURE (INFECTION SUSPECTED)
Bilirubin Urine: NEGATIVE
Glucose, UA: 500 mg/dL — AB
Ketones, ur: NEGATIVE mg/dL
Nitrite: NEGATIVE
Protein, ur: >300 mg/dL — AB
Specific Gravity, Urine: 1.015 (ref 1.005–1.030)
WBC, UA: >50 WBC/hpf (ref 0–5)
pH: >9 — ABNORMAL HIGH (ref 5.0–8.0)

## 2023-09-21 MED ORDER — PHENAZOPYRIDINE HCL 200 MG PO TABS: 200.0000 mg | ORAL_TABLET | Freq: Three times a day (TID) | ORAL | 0 refills | Status: DC

## 2023-09-21 MED ORDER — NITROFURANTOIN MONOHYD MACRO 100 MG PO CAPS: 100.0000 mg | ORAL_CAPSULE | Freq: Two times a day (BID) | ORAL | 0 refills | Status: DC

## 2023-09-21 NOTE — ED Provider Notes (Signed)
 [facility] - [place], [place]   Name: Michelle [name] DOB: 1956/02/22 MRN: [id number] CSN: [id number] PCP: [facility]  Arrival date and time:  09/21/23 1343  Chief Complaint:  Dysuria, Vaginal Itching, and Cough   NOTE: Prior to seeing the patient today, I have reviewed the triage nursing documentation and vital signs. Clinical staff has updated patient's PMH/PSHx, current medication list, and drug allergies/intolerances to ensure comprehensive history available to assist in medical decision making.   History:   HPI: Michelle [name] is a 68 y.o. female who presents today alone with complaints of dysuria.  Patient states the symptoms had approximate 24 hours ago.  She endorses painful urination, odor, and abdominal pain.  She states the symptoms are similar to when she had a UTI in the past.  She states her last UTI was greater than 3 months ago.  She also endorses nausea but no vomiting.  She has chills and bodyaches.  No recent antibiotics, though she did have the flu 3 weeks ago and finished her Tamiflu as directed.  Blood pressure is elevated today, but she notes she has not taken her blood pressure medication yet.   Past Medical History:  Diagnosis Date   Asthma    Depression 2015   Diabetes type 2, uncontrolled 2015   Hypertension     Past Surgical History:  Procedure Laterality Date   ABDOMINAL HYSTERECTOMY  1998    Family History  Problem Relation Age of Onset   Hypertension Mother    Heart disease Mother    Kidney disease Father    Heart disease Father    Hypertension Father     Social History   Tobacco Use   Smoking status: Former    Current packs/day: 0.00    Types: Cigarettes    Quit date: 08/19/2013    Years since quitting: 10.0   Smokeless tobacco: Never  Vaping Use   Vaping status: Never Used  Substance Use Topics   Alcohol use: No   Drug use: No    Patient Active Problem List   Diagnosis Date  Noted   CVA (cerebral vascular accident) (HCC) 06/18/2023   Thrush 07/11/2021   Noncompliance with medication treatment due to intermittent use of medication 01/03/2021   Cataract, nuclear sclerotic, right eye 10/21/2020   OSA (obstructive sleep apnea) 04/14/2020   Osteoarthritis 08/04/2019   Pain management contract signed 07/07/2019   Diabetic neuropathy (HCC) 04/08/2018   Grief 09/24/2017   MDD (major depressive disorder), recurrent severe, without psychosis (HCC) 09/24/2017   Mastodynia 12/26/2016   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   phenazopyridine (PYRIDIUM) 200 MG tablet Take 1 tablet (200 mg total) by mouth 3 (three) times daily.    Allergies:   Ace inhibitors, Ciprofloxacin, Morphine and codeine, Penicillins, and Tirzepatide  Review of Systems (ROS): Review of Systems  Constitutional:  Negative for chills, fatigue and fever.  Gastrointestinal:  Positive for nausea. Negative for abdominal pain, diarrhea and vomiting.  Genitourinary:  Positive for dysuria,  flank pain, frequency and pelvic pain. Negative for difficulty urinating.  Skin:  Negative for color change.  All other systems reviewed and are negative.    Vital Signs: Today's Vitals   09/21/23 1411 09/21/23 1413  BP:  (!) 184/92  Pulse:  83  Resp:  15  Temp:  97.8 F (36.6 C)  TempSrc:  Oral  SpO2:  95%  Weight: 225 lb 15.5 oz (102.5 kg)   Height: 5\' 3"  (1.6 m)   PainSc: 10-Worst pain ever      Physical Exam: Physical Exam Vitals and nursing note reviewed.  Constitutional:      Appearance: Normal appearance.  Cardiovascular:     Rate and Rhythm: Normal rate and regular rhythm.     Pulses: Normal pulses.     Heart sounds: Normal heart sounds.  Pulmonary:     Effort: Pulmonary effort is normal.     Breath sounds: Normal breath sounds.  Abdominal:     Tenderness: There is abdominal tenderness in the right lower quadrant, suprapubic area and left lower quadrant. There is right CVA tenderness. There is no left CVA tenderness.  Skin:    General: Skin is warm and dry.  Neurological:     General: No focal deficit present.     Mental Status: She is alert and oriented to person, place, and time.  Psychiatric:        Mood and Affect: Mood normal.        Behavior: Behavior normal.      Urgent Care Treatments / Results:   LABS: PLEASE NOTE: all labs that were ordered this encounter are listed, however only abnormal results are displayed. Labs Reviewed  WET PREP, GENITAL - Abnormal; Notable for the following components:      Result Value   WBC, Wet Prep HPF POC >10 (*)    All other components within normal limits  URINALYSIS, W/ REFLEX TO CULTURE (INFECTION SUSPECTED) - Abnormal; Notable for the following components:   APPearance CLOUDY (*)    pH >9.0 (*)    Glucose, UA 500 (*)    Hgb urine dipstick TRACE (*)    Protein, ur >300 (*)    Leukocytes,Ua SMALL (*)    Bacteria, UA MANY (*)    All other components within normal limits  URINE CULTURE    EKG: -None  RADIOLOGY: No results found.  PROCEDURES: Procedures  MEDICATIONS RECEIVED THIS VISIT: Medications - No data to display  PERTINENT CLINICAL COURSE NOTES/UPDATES:   Initial Impression / Assessment and Plan / Urgent Care Course:  Pertinent labs & imaging results that were available during my care of the patient were personally reviewed by me and considered in my medical decision making (see lab/imaging  section of note for values and interpretations).  Michelle [name] is a 68 y.o. female who presents to [facility] today with complaints of painful urination, diagnosed with urinary tract infection, and treated as such with the medications below. NP and patient reviewed discharge instructions below during visit.   Patient is well appearing overall in clinic today. She does not appear to be in any acute distress. Presenting symptoms (see HPI) and exam as documented above.   I have reviewed the follow up and strict return precautions for any new or worsening symptoms. Patient is aware of symptoms that would be deemed urgent/emergent, and would thus require further evaluation either here or in the emergency department. At the time of discharge, she verbalized understanding and consent with the  discharge plan as it was reviewed with her. All questions were fielded by provider and/or clinic staff prior to patient discharge.    Final Clinical Impressions / Urgent Care Diagnoses:   Final diagnoses:  Lower urinary tract infectious disease    New Prescriptions:  [place] Controlled Substance Registry consulted? Not Applicable  Meds ordered this encounter  Medications   nitrofurantoin, macrocrystal-monohydrate, (MACROBID) 100 MG capsule    Sig: Take 1 capsule (100 mg total) by mouth 2 (two) times daily.    Dispense:  14 capsule    Refill:  0   phenazopyridine (PYRIDIUM) 200 MG tablet    Sig: Take 1 tablet (200 mg total) by mouth 3 (three) times daily.    Dispense:  6 tablet    Refill:  0      Discharge Instructions      You were seen for urinary symptoms and are being treated for urinary tract infection.   - Take the antibiotics as prescribed until they're finished. If you think you're having a reaction, stop the medication, take benadryl and go to the nearest urgent care/emergency room. Take a probiotic while taking the antibiotic to decrease the chances of stomach upset.  - We  are sending your urine out for a culture. If we need to add or change any medications, our nurse will give you a call to let you know. -Increase your water intake.  Take your Pyridium as directed with no more than 2 days.  Take care, Dr. Sharlet Salina, NP-c     Recommended Follow up Care:  Patient encouraged to follow up with the following provider within the specified time frame, or sooner as dictated by the severity of her symptoms. As always, she was instructed that for any urgent/emergent care needs, she should seek care either here or in the emergency department for more immediate evaluation.   Bailey Mech, DNP, NP-c   Bailey Mech, NP 09/21/23 1455

## 2023-09-21 NOTE — ED Triage Notes (Signed)
 Patient c/o dysuria and vaginal itching that started yesterday.  Patient c/o cough and congestion for 2 weeks.  Patient denies fevers.  Patient previously diagnosed and treated for the flu.

## 2023-09-21 NOTE — Discharge Instructions (Signed)
 You were seen for urinary symptoms and are being treated for urinary tract infection.   - Take the antibiotics as prescribed until they're finished. If you think you're having a reaction, stop the medication, take benadryl and go to the nearest urgent care/emergency room. Take a probiotic while taking the antibiotic to decrease the chances of stomach upset.  - We are sending your urine out for a culture. If we need to add or change any medications, our nurse will give you a call to let you know. -Increase your water intake.  Take your Pyridium as directed with no more than 2 days.  Take care, Dr. Sharlet Salina, NP-c

## 2023-09-23 ENCOUNTER — Telehealth (HOSPITAL_COMMUNITY): Payer: Self-pay

## 2023-09-23 LAB — URINE CULTURE: Culture: >=100000 — AB

## 2023-09-23 MED ORDER — SULFAMETHOXAZOLE-TRIMETHOPRIM 800-160 MG PO TABS: 1.0000 | ORAL_TABLET | Freq: Two times a day (BID) | ORAL | 0 refills | Status: AC

## 2023-09-23 NOTE — Telephone Encounter (Signed)
 Per protocol, pt to dc Macrobid and begin treatment with Bactrim.  Reviewed with patient, verified pharmacy, prescription sent.

## 2023-10-31 DIAGNOSIS — E113311 Type 2 diabetes mellitus with moderate nonproliferative diabetic retinopathy with macular edema, right eye: Secondary | ICD-10-CM | POA: Insufficient documentation

## 2023-11-25 ENCOUNTER — Encounter: Payer: Self-pay | Admitting: Cardiology

## 2023-11-25 ENCOUNTER — Ambulatory Visit: Payer: 59 | Attending: Cardiology | Admitting: Cardiology

## 2023-11-25 ENCOUNTER — Ambulatory Visit

## 2023-11-25 VITALS — BP 150/73 | HR 72 | Ht 64.0 in | Wt 216.0 lb

## 2023-11-25 DIAGNOSIS — I10 Essential (primary) hypertension: Secondary | ICD-10-CM | POA: Diagnosis not present

## 2023-11-25 DIAGNOSIS — E7800 Pure hypercholesterolemia, unspecified: Secondary | ICD-10-CM | POA: Diagnosis not present

## 2023-11-25 DIAGNOSIS — R079 Chest pain, unspecified: Secondary | ICD-10-CM

## 2023-11-25 DIAGNOSIS — G459 Transient cerebral ischemic attack, unspecified: Secondary | ICD-10-CM | POA: Diagnosis not present

## 2023-11-25 DIAGNOSIS — R0602 Shortness of breath: Secondary | ICD-10-CM

## 2023-11-25 MED ORDER — OMEPRAZOLE 20 MG PO CPDR: 20.0000 mg | DELAYED_RELEASE_CAPSULE | Freq: Every day | ORAL | Status: AC

## 2023-11-25 NOTE — Patient Instructions (Signed)
 Medication Instructions:  START over the counter Prilosec 20 mg daily   *If you need a refill on your cardiac medications before your next appointment, please call your pharmacy*  Testing/Procedures: Your physician has requested that you have an echocardiogram. Echocardiography is a painless test that uses sound waves to create images of your heart. It provides your doctor with information about the size and shape of your heart and how well your heart's chambers and valves are working.   You may receive an ultrasound enhancing agent through an IV if needed to better visualize your heart during the echo. This procedure takes approximately one hour.  There are no restrictions for this procedure.  This will take place at [address] [facility] [address], [place] [address]  Please note: We ask at that you not bring children with you during ultrasound (echo/ vascular) testing. Due to room size and safety concerns, children are not allowed in the ultrasound rooms during exams. Our front office staff cannot provide observation of children in our lobby area while testing is being conducted. An adult accompanying a patient to their appointment will only be allowed in the ultrasound room at the discretion of the ultrasound technician under special circumstances. We apologize for any inconvenience.    ZIO XT- Long Term Monitor Instructions  Your physician has requested you wear a ZIO patch monitor for 14 days.  This is a single patch monitor. [employer] supplies one patch monitor per enrollment. Additional stickers are not available. Please do not apply patch if you will be having a Nuclear Stress Test,  Echocardiogram, Cardiac CT, MRI, or Chest Xray during the period you would be wearing the  monitor. The patch cannot be worn during these tests. You cannot remove and re-apply the  ZIO XT patch monitor.  Your ZIO patch monitor will be mailed 3 day USPS to your address on file. It may take  3-5 days  to receive your monitor after you have been enrolled.  Once you have received your monitor, please review the enclosed instructions. Your monitor  has already been registered assigning a specific monitor serial # to you.  Billing and Patient Assistance Program Information  We have supplied [employer] with any of your insurance information on file for billing purposes. [employer] offers a sliding scale Patient Assistance Program for patients that do not have  insurance, or whose insurance does not completely cover the cost of the ZIO monitor.  You must apply for the Patient Assistance Program to qualify for this discounted rate.  To apply, please call [employer] at [phone], select option 4, select option 2, ask to apply for  Patient Assistance Program. Sanna Crystal will ask your household income, and how many people  are in your household. They will quote your out-of-pocket cost based on that information.  [employer] will also be able to set up a 8-month, interest-free payment plan if needed.  Applying the monitor   Shave hair from upper left chest.  Hold abrader disc by orange tab. Rub abrader in 40 strokes over the upper left chest as  indicated in your monitor instructions.  Clean area with 4 enclosed alcohol pads. Let dry.  Apply patch as indicated in monitor instructions. Patch will be placed under collarbone on left  side of chest with arrow pointing upward.  Rub patch adhesive wings for 2 minutes. Remove white label marked "1". Remove the white  label marked "2". Rub patch adhesive wings for 2 additional minutes.  While looking in  a mirror, press and release button in center of patch. A small green light will  flash 3-4 times. This will be your only indicator that the monitor has been turned on.  Do not shower for the first 24 hours. You may shower after the first 24 hours.  Press the button if you feel a symptom. You will hear a small click. Record Date, Time and  Symptom in the  Patient Logbook.  When you are ready to remove the patch, follow instructions on the last 2 pages of Patient  Logbook. Stick patch monitor onto the last page of Patient Logbook.  Place Patient Logbook in the blue and white box. Use locking tab on box and tape box closed  securely. The blue and white box has prepaid postage on it. Please place it in the mailbox as  soon as possible. Your physician should have your test results approximately 7 days after the  monitor has been mailed back to [facility].  Call [facility] Customer Care at [phone] if you have questions regarding  your ZIO XT patch monitor. Call them immediately if you see an orange light blinking on your  monitor.  If your monitor falls off in less than 4 days, contact our Monitor department at [phone].  If your monitor becomes loose or falls off after 4 days call [employer] at [phone] for  suggestions on securing your monitor   Follow-Up: At [facility], you and your health needs are our priority.  As part of our continuing mission to provide you with exceptional heart care, our providers are all part of one team.  This team includes your primary Cardiologist (physician) and Advanced Practice Providers or APPs (Physician Assistants and Nurse Practitioners) who all work together to provide you with the care you need, when you need it.  Your next appointment:   3 month(s)  Provider:   You may see Dr.Agbor-Etang or one of the following Advanced Practice Providers on your designated Care Team:   Laneta Pintos, NP Gildardo Labrador, PA-C Varney Gentleman, PA-C Cadence Bolton, PA-C Ronald Cockayne, NP Morey Ar, NP    We recommend signing up for the patient portal called "MyChart".  Sign up information is provided on this After Visit Summary.  MyChart is used to connect with patients for Virtual Visits (Telemedicine).  Patients are able to view lab/test results, encounter notes, upcoming appointments,  etc.  Non-urgent messages can be sent to your provider as well.   To learn more about what you can do with MyChart, go to [email or web address].

## 2023-11-25 NOTE — Progress Notes (Signed)
 Cardiology Office Note:    Date:  11/25/2023   ID:  Michelle [name], DOB 30-Aug-1955, MRN [id number]  PCP:  [facility]   [facility] Providers Cardiologist:  None     Referring MD: [facility]   Chief Complaint  Patient presents with   Follow-up    Had some chest pain and SOB last week     History of Present Illness:    Michelle [name] is a 68 y.o. female with a hx of hypertension, hyperlipidemia, diabetes, OSA on CPAP who presents due to hospital follow-up for left-sided weakness  Patient admitted to hospital, [facility] on 06/2023 with left-sided weakness ongoing 1 week.  Workup with head CT and MRA did not show acute CVA.  Patient diagnosed with a TIA versus hyperglycemia due to blood sugars 453.  Outpatient echo was recommended as he could not be obtained inpatient due to a scheduling issue.  DAPT with aspirin  and Plavix  x 3 weeks was recommended by neurology.  Patient's PTA Crestor  was continued.  Left arm weakness subsequently resolved.  Has occasional nonexertional shortness of breath.  Complains of occasional chest discomfort and soreness in her throat.  Denies any association with spicy foods.  Was previously on GLP-1 but stopped due to nausea and abdominal bloating.  Past Medical History:  Diagnosis Date   Asthma    Depression 2015   Diabetes type 2, uncontrolled 2015   Hypertension     Past Surgical History:  Procedure Laterality Date   ABDOMINAL HYSTERECTOMY  1998    Current Medications: Current Meds  Medication Sig   albuterol  (PROVENTIL  HFA;VENTOLIN  HFA) 108 (90 BASE) MCG/ACT inhaler Inhale 2 puffs into the lungs every 4 (four) hours as needed for wheezing or shortness of breath.   aspirin  81 MG EC tablet Take 1 tablet by mouth daily.   budesonide -formoterol  (SYMBICORT ) 160-4.5 MCG/ACT inhaler Inhale 2 puffs into the lungs 1 day or 1 dose.   DULoxetine (CYMBALTA) 60 MG capsule Take by mouth.   FARXIGA   10 MG TABS tablet Take 10 mg by mouth daily.   fluticasone  (FLONASE) 50 MCG/ACT nasal spray Place into the nose.   hydrOXYzine (ATARAX) 50 MG tablet Take 50 mg by mouth 3 (three) times daily.   insulin  glargine (LANTUS) 100 UNIT/ML injection Inject 10 Units into the skin at bedtime.   ketoconazole (NIZORAL) 2 % cream Apply 1 Application topically daily.   olmesartan -hydrochlorothiazide  (BENICAR  HCT) 40-25 MG tablet Take 1 tablet by mouth daily.   omeprazole  (PRILOSEC) 20 MG capsule Take 1 capsule (20 mg total) by mouth daily.   traZODone (DESYREL) 50 MG tablet Take 1-3 tablets each night AS NEEDED for sleep.     Allergies:   Ace inhibitors, Ciprofloxacin , Morphine and codeine, Penicillins, and Tirzepatide   Social History   Socioeconomic History   Marital status: Single    Spouse name: Not on file   Number of children: Not on file   Years of education: Not on file   Highest education level: Not on file  Occupational History   Not on file  Tobacco Use   Smoking status: Former    Current packs/day: 0.00    Types: Cigarettes    Quit date: 08/19/2013    Years since quitting: 10.2   Smokeless tobacco: Never  Vaping Use   Vaping status: Never Used  Substance and Sexual Activity   Alcohol use: No   Drug use: No   Sexual activity: Yes  Other Topics  Concern   Not on file  Social History Narrative   Not on file   Social Drivers of Health   Financial Resource Strain: Medium Risk (11/18/2023)   Received from [facility]   Overall Financial Resource Strain (CARDIA)    Difficulty of Paying Living Expenses: Somewhat hard  Food Insecurity: No Food Insecurity (11/18/2023)   Received from [facility]   Hunger Vital Sign    Worried About Running Out of Food in the Last Year: Never true    Ran Out of Food in the Last Year: Never true  Transportation Needs: Unmet Transportation Needs (11/18/2023)   [facility]   PRAPARE - Transportation    Lack of Transportation  (Medical): Yes    Lack of Transportation (Non-Medical): No  Physical Activity: Insufficiently Active (11/18/2023)   Received from [facility]   Exercise Vital Sign    Days of Exercise per Week: 3 days    Minutes of Exercise per Session: 30 min  Stress: Stress Concern Present (11/18/2023)   Received from [facility] - Occupational Stress Questionnaire    Feeling of Stress : To some extent  Social Connections: Moderately Integrated (11/18/2023)   Received from [facility]   Social Connection and Isolation Panel [NHANES]    Frequency of Communication with Friends and Family: More than three times a week    Frequency of Social Gatherings with Friends and Family: Three times a week    Attends Religious Services: More than 4 times per year    Active Member of Clubs or Organizations: Yes    Attends Engineer, structural: More than 4 times per year    Marital Status: Divorced     Family History: The patient's family history includes Heart disease in her father and mother; Hypertension in her father and mother; Kidney disease in her father.  ROS:   Please see the history of present illness.     All other systems reviewed and are negative.  EKGs/Labs/Other Studies Reviewed:    The following studies were reviewed today:  EKG Interpretation Date/Time:  Monday November 25 2023 11:25:34 EDT Ventricular Rate:  72 PR Interval:  176 QRS Duration:  92 QT Interval:  402 QTC Calculation: 440 R Axis:   -15  Text Interpretation: Normal sinus rhythm Moderate voltage criteria for LVH, may be normal variant ( R in aVL , Cornell product ) Confirmed by Constancia Delton ([id number]) on 11/25/2023 11:32:49 AM    Recent Labs: 06/18/2023: ALT 12; BUN 22; Creatinine, Ser 1.50; Hemoglobin 11.7; Platelets 344; Potassium 3.7; Sodium 129  Recent Lipid Panel    Component Value Date/Time   CHOL 135 06/19/2023 0530   CHOL 172 03/13/2012 0512   TRIG 127  06/19/2023 0530   TRIG 227 (H) 03/13/2012 0512   HDL 35 (L) 06/19/2023 0530   HDL 36 (L) 03/13/2012 0512   CHOLHDL 3.9 06/19/2023 0530   VLDL 25 06/19/2023 0530   VLDL 45 (H) 03/13/2012 0512   LDLCALC 75 06/19/2023 0530   LDLCALC 91 03/13/2012 0512     Risk Assessment/Calculations:         Physical Exam:    VS:  BP (!) 150/73 (BP Location: Left Arm, Patient Position: Sitting, Cuff Size: Normal)   Pulse 72   Ht 5\' 4"  (1.626 m)   Wt 216 lb (98 kg)   BMI 37.08 kg/m     Wt Readings from Last  3 Encounters:  11/25/23 216 lb (98 kg)  09/21/23 225 lb 15.5 oz (102.5 kg)  06/18/23 226 lb (102.5 kg)     GEN:  Well nourished, well developed in no acute distress HEENT: Normal NECK: No JVD; No carotid bruits CARDIAC: RRR, no murmurs, rubs, gallops RESPIRATORY:  Clear to auscultation without rales, wheezing or rhonchi  ABDOMEN: Soft, non-tender, non-distended MUSCULOSKELETAL:  No edema; No deformity  SKIN: Warm and dry NEUROLOGIC:  Alert and oriented x 3 PSYCHIATRIC:  Normal affect   ASSESSMENT:    1. TIA (transient ischemic attack)   2. Primary hypertension   3. Pure hypercholesterolemia   4. Chest pain of uncertain etiology   5. Shortness of breath    PLAN:    In order of problems listed above:  History of TIA, left arm weakness.  Currently resolved.  Place cardiac monitor to rule out any significant arrhythmias such as A-fib or flutter, obtain echocardiogram. Hypertension, BP elevated, monitor for now.  Continue olmesartan -HCTZ 40-25 mg daily, Norvasc 10 mg daily.  Consider addition of beta-blocker if BP stays elevated at follow-up visit. Hyperlipidemia, cholesterol controlled, continue Crestor  20 mg daily. Chest pain, atypical, likely from reflux.  Start OTC omeprazole  20 mg daily. Nonspecific shortness of breath, echo as above.  If symptoms persist, consider ischemic workup.  Follow-up in 2 to 3 months.       Medication Adjustments/Labs and Tests  Ordered: Current medicines are reviewed at length with the patient today.  Concerns regarding medicines are outlined above.  Orders Placed This Encounter  Procedures   LONG TERM MONITOR (3-14 DAYS)   EKG 12-Lead   ECHOCARDIOGRAM COMPLETE   Meds ordered this encounter  Medications   omeprazole  (PRILOSEC) 20 MG capsule    Sig: Take 1 capsule (20 mg total) by mouth daily.    Patient Instructions  Medication Instructions:  START over the counter Prilosec 20 mg daily   *If you need a refill on your cardiac medications before your next appointment, please call your pharmacy*  Testing/Procedures: Your physician has requested that you have an echocardiogram. Echocardiography is a painless test that uses sound waves to create images of your heart. It provides your doctor with information about the size and shape of your heart and how well your heart's chambers and valves are working.   You may receive an ultrasound enhancing agent through an IV if needed to better visualize your heart during the echo. This procedure takes approximately one hour.  There are no restrictions for this procedure.  This will take place at [address] [facility] [address], [place] [address]  Please note: We ask at that you not bring children with you during ultrasound (echo/ vascular) testing. Due to room size and safety concerns, children are not allowed in the ultrasound rooms during exams. Our front office staff cannot provide observation of children in our lobby area while testing is being conducted. An adult accompanying a patient to their appointment will only be allowed in the ultrasound room at the discretion of the ultrasound technician under special circumstances. We apologize for any inconvenience.    ZIO XT- Long Term Monitor Instructions  Your physician has requested you wear a ZIO patch monitor for 14 days.  This is a single patch monitor. [employer] supplies one patch monitor per  enrollment. Additional stickers are not available. Please do not apply patch if you will be having a Nuclear Stress Test,  Echocardiogram, Cardiac CT, MRI, or Chest Xray during the  period you would be wearing the  monitor. The patch cannot be worn during these tests. You cannot remove and re-apply the  ZIO XT patch monitor.  Your ZIO patch monitor will be mailed 3 day USPS to your address on file. It may take 3-5 days  to receive your monitor after you have been enrolled.  Once you have received your monitor, please review the enclosed instructions. Your monitor  has already been registered assigning a specific monitor serial # to you.  Billing and Patient Assistance Program Information  We have supplied [employer] with any of your insurance information on file for billing purposes. [employer] offers a sliding scale Patient Assistance Program for patients that do not have  insurance, or whose insurance does not completely cover the cost of the ZIO monitor.  You must apply for the Patient Assistance Program to qualify for this discounted rate.  To apply, please call [employer] at [phone], select option 4, select option 2, ask to apply for  Patient Assistance Program. Sanna Crystal will ask your household income, and how many people  are in your household. They will quote your out-of-pocket cost based on that information.  [employer] will also be able to set up a 66-month, interest-free payment plan if needed.  Applying the monitor   Shave hair from upper left chest.  Hold abrader disc by orange tab. Rub abrader in 40 strokes over the upper left chest as  indicated in your monitor instructions.  Clean area with 4 enclosed alcohol pads. Let dry.  Apply patch as indicated in monitor instructions. Patch will be placed under collarbone on left  side of chest with arrow pointing upward.  Rub patch adhesive wings for 2 minutes. Remove white label marked "1". Remove the white  label marked "2". Rub patch  adhesive wings for 2 additional minutes.  While looking in a mirror, press and release button in center of patch. A small green light will  flash 3-4 times. This will be your only indicator that the monitor has been turned on.  Do not shower for the first 24 hours. You may shower after the first 24 hours.  Press the button if you feel a symptom. You will hear a small click. Record Date, Time and  Symptom in the Patient Logbook.  When you are ready to remove the patch, follow instructions on the last 2 pages of Patient  Logbook. Stick patch monitor onto the last page of Patient Logbook.  Place Patient Logbook in the blue and white box. Use locking tab on box and tape box closed  securely. The blue and white box has prepaid postage on it. Please place it in the mailbox as  soon as possible. Your physician should have your test results approximately 7 days after the  monitor has been mailed back to [facility].  Call [facility] Customer Care at [phone] if you have questions regarding  your ZIO XT patch monitor. Call them immediately if you see an orange light blinking on your  monitor.  If your monitor falls off in less than 4 days, contact our Monitor department at [phone].  If your monitor becomes loose or falls off after 4 days call [employer] at [phone] for  suggestions on securing your monitor   Follow-Up: At [facility], you and your health needs are our priority.  As part of our continuing mission to provide you with exceptional heart care, our providers are all part of one team.  This team includes your primary  Cardiologist (physician) and Advanced Practice Providers or APPs (Physician Assistants and Nurse Practitioners) who all work together to provide you with the care you need, when you need it.  Your next appointment:   3 month(s)  Provider:   You may see Dr.Agbor-Etang or one of the following Advanced Practice Providers on your designated Care Team:    Laneta Pintos, NP Gildardo Labrador, PA-C Varney Gentleman, PA-C Cadence Unity, PA-C Ronald Cockayne, NP Morey Ar, NP    We recommend signing up for the patient portal called "MyChart".  Sign up information is provided on this After Visit Summary.  MyChart is used to connect with patients for Virtual Visits (Telemedicine).  Patients are able to view lab/test results, encounter notes, upcoming appointments, etc.  Non-urgent messages can be sent to your provider as well.   To learn more about what you can do with MyChart, go to [email or web address].        Signed, Constancia Delton, MD  11/25/2023 12:43 PM    [facility]

## 2023-12-17 ENCOUNTER — Ambulatory Visit (LOCAL_COMMUNITY_HEALTH_CENTER): Payer: Self-pay

## 2023-12-17 DIAGNOSIS — Z111 Encounter for screening for respiratory tuberculosis: Secondary | ICD-10-CM

## 2023-12-19 ENCOUNTER — Ambulatory Visit

## 2023-12-19 DIAGNOSIS — Z111 Encounter for screening for respiratory tuberculosis: Secondary | ICD-10-CM

## 2023-12-19 LAB — TB SKIN TEST
Induration: 0 mm
TB Skin Test: NEGATIVE

## 2023-12-25 ENCOUNTER — Ambulatory Visit: Payer: Self-pay | Admitting: Cardiology

## 2023-12-25 DIAGNOSIS — G459 Transient cerebral ischemic attack, unspecified: Secondary | ICD-10-CM

## 2024-01-06 ENCOUNTER — Ambulatory Visit: Attending: Cardiology

## 2024-01-24 ENCOUNTER — Ambulatory Visit: Attending: Cardiology

## 2024-01-24 DIAGNOSIS — R0602 Shortness of breath: Secondary | ICD-10-CM

## 2024-01-24 LAB — ECHOCARDIOGRAM COMPLETE
AR max vel: 2.38 cm2
AV Area VTI: 2.51 cm2
AV Area mean vel: 2.44 cm2
AV Mean grad: 3 mmHg
AV Peak grad: 5.1 mmHg
Ao pk vel: 1.13 m/s
Area-P 1/2: 3.42 cm2
Calc EF: 38 %
S' Lateral: 2.9 cm
Single Plane A2C EF: 40.4 %
Single Plane A4C EF: 36.5 %

## 2024-03-02 ENCOUNTER — Ambulatory Visit: Attending: Cardiology | Admitting: Cardiology

## 2024-04-15 DIAGNOSIS — D649 Anemia, unspecified: Secondary | ICD-10-CM | POA: Insufficient documentation

## 2024-04-15 DIAGNOSIS — N189 Chronic kidney disease, unspecified: Secondary | ICD-10-CM | POA: Insufficient documentation

## 2024-04-15 DIAGNOSIS — I5030 Unspecified diastolic (congestive) heart failure: Secondary | ICD-10-CM | POA: Insufficient documentation

## 2024-04-15 DIAGNOSIS — Z8673 Personal history of transient ischemic attack (TIA), and cerebral infarction without residual deficits: Secondary | ICD-10-CM | POA: Insufficient documentation

## 2024-08-25 ENCOUNTER — Ambulatory Visit
Admission: EM | Admit: 2024-08-25 | Discharge: 2024-08-25 | Disposition: A | Discharge status: Home / Self Care | Attending: Emergency Medicine | Admitting: Emergency Medicine

## 2024-08-25 ENCOUNTER — Encounter: Payer: Self-pay | Admitting: Emergency Medicine

## 2024-08-25 DIAGNOSIS — R399 Unspecified symptoms and signs involving the genitourinary system: Secondary | ICD-10-CM | POA: Insufficient documentation

## 2024-08-25 DIAGNOSIS — N390 Urinary tract infection, site not specified: Secondary | ICD-10-CM | POA: Insufficient documentation

## 2024-08-25 LAB — POCT URINE DIPSTICK
Bilirubin, UA: NEGATIVE
Glucose, UA: >=1000 mg/dL — AB
Ketones, POC UA: NEGATIVE mg/dL
Nitrite, UA: POSITIVE — AB
Protein Ur, POC: 100 mg/dL — AB
Spec Grav, UA: 1.015
Urobilinogen, UA: 1 U/dL
pH, UA: 5.5

## 2024-08-25 MED ORDER — PHENAZOPYRIDINE HCL 200 MG PO TABS: 200.0000 mg | ORAL_TABLET | Freq: Three times a day (TID) | ORAL | 0 refills | Status: AC

## 2024-08-25 MED ORDER — NITROFURANTOIN MONOHYD MACRO 100 MG PO CAPS: 100.0000 mg | ORAL_CAPSULE | Freq: Two times a day (BID) | ORAL | 0 refills | Status: AC

## 2024-08-25 NOTE — ED Provider Notes (Signed)
 " [facility]    CSN: [id number] Arrival date & time: 08/25/24  1625      History   Chief Complaint Chief Complaint  Patient presents with   Dysuria    HPI Michelle [name] is a 70 y.o. female.   HPI  69 year old female with past medical history significant for depression, primary hypertension, hyperlipidemia, diabetic neuropathy, moderate mitten asthma, MDD, type 2 diabetes, CVA, and obesity presents for evaluation of painful urination and foul odor to her urine that started 1 day ago.  She was seen on 08/11/2024 at [facility] for similar symptoms and treated with Keflex .  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, with long-term current use of insulin  (HCC) 10/31/2023   CVA (cerebral vascular accident) (HCC) 06/18/2023   Type 2 diabetes mellitus with retinopathy of both eyes, without long-term current use of insulin  (HCC) 05/04/2023   Osteopenia 07/13/2022   Thrush 07/11/2021   Noncompliance with medication treatment due to intermittent use of medication 01/03/2021   Cataract, nuclear sclerotic, right eye 10/21/2020   OSA (obstructive sleep apnea) 04/14/2020   Osteoarthritis 08/04/2019   Pain management contract signed 07/07/2019   Diabetic neuropathy (HCC) 04/08/2018   Grief 09/24/2017   MDD (major depressive disorder), recurrent severe, without psychosis (HCC) 09/24/2017   Insomnia 09/24/2017   Mastodynia 12/26/2016   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/23/2015   Recurrent  major depressive disorder, in partial remission 07/19/2015   Mild intermittent asthma without complication 01/08/2014   Depression 11/23/2013   Primary hypertension 10/08/2013   Hyperlipidemia 12/25/2011   Type 2 diabetes mellitus with complication, with long-term current use of insulin  (HCC) 12/25/2011    Past Surgical History:  Procedure Laterality Date   ABDOMINAL HYSTERECTOMY  1998    OB History     Gravida  5   Para  4   Term      Preterm      AB      Living         SAB      IAB      Ectopic      Multiple      Live Births               Home Medications    Prior to Admission medications  Medication Sig Start Date End Date Taking? Authorizing Provider  ezetimibe (ZETIA) 10 MG tablet Take 10 mg by mouth daily. 07/23/24 07/23/25 Yes [provider]  ketoconazole (NIZORAL) 2 % cream Apply 1 Application topically daily. 11/19/23 11/18/24 Yes [provider]  nitrofurantoin , macrocrystal-monohydrate, (MACROBID ) 100 MG capsule Take 1 capsule (100 mg total) by mouth 2 (two) times daily for 7 days. 08/25/24 09/01/24 Yes Bernardino Ditch, NP  phenazopyridine  (PYRIDIUM ) 200 MG tablet Take 1 tablet (200 mg total) by mouth 3 (three) times daily. 08/25/24  Yes Bernardino Ditch, NP  spironolactone (ALDACTONE) 25 MG tablet Take 12.5 mg by mouth daily. 06/30/24 06/30/25 Yes [provider]  albuterol  (PROVENTIL  HFA;VENTOLIN  HFA)  108 (90 BASE) MCG/ACT inhaler Inhale 2 puffs into the lungs every 4 (four) hours as needed for wheezing or shortness of breath. 12/08/14   Defelice, Jeanette, NP  amLODipine (NORVASC) 10 MG tablet Take 1 tablet by mouth daily. 10/27/20 10/27/21  [provider]  aspirin  81 MG EC tablet Take 1 tablet by mouth daily. 02/29/20   [provider]  budesonide -formoterol  (SYMBICORT ) 160-4.5 MCG/ACT inhaler Inhale 2 puffs into the lungs 1 day or 1 dose. 01/22/20   Sofia, Leslie K, PA-C  cetirizine (ZYRTEC) 10 MG tablet Take 1  tablet by mouth daily. 11/30/20 11/30/21  [provider]  DULoxetine (CYMBALTA) 60 MG capsule Take by mouth. 09/17/21   [provider]  FARXIGA  10 MG TABS tablet Take 10 mg by mouth daily. 02/01/23   [provider]  fluticasone  (FLONASE) 50 MCG/ACT nasal spray Place into the nose. 05/08/20   [provider]  gabapentin (NEURONTIN) 300 MG capsule Take 300 mg by mouth 3 (three) times daily. Patient not taking: Reported on 11/25/2023 08/14/21   [provider]  hydrOXYzine (ATARAX) 50 MG tablet Take 50 mg by mouth 3 (three) times daily. 02/19/21   [provider]  insulin  glargine (LANTUS) 100 UNIT/ML injection Inject 10 Units into the skin at bedtime.    [provider]  ipratropium (ATROVENT ) 0.06 % nasal spray Place 2 sprays into both nostrils 4 (four) times daily. Patient not taking: Reported on 11/25/2023 03/07/23   Bernardino Ditch, NP  olmesartan -hydrochlorothiazide  (BENICAR  HCT) 40-25 MG tablet Take 1 tablet by mouth daily. 06/03/23   [provider]  omeprazole  (PRILOSEC) 20 MG capsule Take 1 capsule (20 mg total) by mouth daily. 11/25/23   Darliss Rogue, MD  rosuvastatin  (CRESTOR ) 20 MG tablet Take 1 tablet by mouth daily. Patient not taking: Reported on 11/25/2023 02/01/22   [provider]  Semaglutide (RYBELSUS) 3 MG TABS Take 3 mg by mouth daily at 6 (six) AM. Patient not taking: Reported on 11/25/2023    [provider]  traMADol  (ULTRAM ) 50 MG tablet Take 1 tablet (50 mg total) by mouth every 6 (six) hours as needed. Patient not taking: Reported on 11/25/2023 06/15/22   Teresa Shelba SAUNDERS, NP  traZODone (DESYREL) 50 MG tablet Take 1-3 tablets each night AS NEEDED for sleep. 03/19/19   [provider]  fexofenadine  (ALLEGRA ) 60 MG tablet Take 1 tablet (60 mg total) by mouth 2 (two) times daily. 08/07/16 01/22/20  Lacinda Elsie SQUIBB, PA-C  lisinopril (PRINIVIL,ZESTRIL) 10 MG tablet Take 10 mg by mouth  daily.  01/22/20  [provider]    Family History Family History  Problem Relation Age of Onset   Hypertension Mother    Heart disease Mother    Kidney disease Father    Heart disease Father    Hypertension Father     Social History Social History[1]   Allergies   Ace inhibitors, Ciprofloxacin , Morphine and codeine, Penicillins, and Tirzepatide   Review of Systems Review of Systems  Constitutional:  Negative for fever.  Genitourinary:  Positive for dysuria, frequency and urgency. Negative for hematuria.  Musculoskeletal:  Negative for back pain.     Physical Exam Triage Vital Signs ED Triage Vitals  Encounter Vitals Group     BP      Girls Systolic BP Percentile      Girls Diastolic BP Percentile      Boys Systolic BP Percentile      Boys Diastolic BP Percentile  Pulse      Resp      Temp      Temp src      SpO2      Weight      Height      Head Circumference      Peak Flow      Pain Score      Pain Loc      Pain Education      Exclude from Growth Chart    No data found.  Updated Vital Signs BP (!) 168/81 (BP Location: Left Arm)   Pulse 85   Temp 98.6 F (37 C) (Oral)   Resp 18   Wt 225 lb 6.4 oz (102.2 kg)   SpO2 95%   BMI 38.69 kg/m   Visual Acuity Right Eye Distance:   Left Eye Distance:   Bilateral Distance:    Right Eye Near:   Left Eye Near:    Bilateral Near:     Physical Exam Vitals and nursing note reviewed.  Constitutional:      Appearance: Normal appearance. She is not ill-appearing.  HENT:     Head: Normocephalic and atraumatic.  Cardiovascular:     Rate and Rhythm: Normal rate and regular rhythm.     Pulses: Normal pulses.     Heart sounds: Normal heart sounds. No murmur heard.    No friction rub. No gallop.  Pulmonary:     Effort: Pulmonary effort is normal.     Breath sounds: Normal breath sounds. No wheezing, rhonchi or rales.  Abdominal:     Tenderness: There is no right CVA tenderness or left CVA  tenderness.  Skin:    General: Skin is warm and dry.     Capillary Refill: Capillary refill takes less than 2 seconds.     Findings: No rash.  Neurological:     General: No focal deficit present.     Mental Status: She is alert and oriented to person, place, and time.      UC Treatments / Results  Labs (all labs ordered are listed, but only abnormal results are displayed) Labs Reviewed  POCT URINE DIPSTICK - Abnormal; Notable for the following components:      Result Value   Glucose, UA >=1,000 (*)    Blood, UA moderate (*)    Protein Ur, POC =100 (*)    Nitrite, UA Positive (*)    Leukocytes, UA Trace (*)    All other components within normal limits  URINE CULTURE    EKG   Radiology No results found.  Procedures Procedures (including critical care time)  Medications Ordered in UC Medications - No data to display  Initial Impression / Assessment and Plan / UC Course  I have reviewed the triage vital signs and the nursing notes.  Pertinent labs & imaging results that were available during my care of the patient were reviewed by me and considered in my medical decision making (see chart for details).   Patient is a pleasant, nontoxic-appearing 69 year old female presenting for evaluation of UTI symptoms that started yesterday.  She was seen at [facility] on 08/11/2024 for similar symptoms and diagnosed with a UTI that was treated with Keflex .  She was prescribed a 7-day course and reports that she took all the medication with resolution of her symptoms.  They did not return until yesterday.  Urine culture from that day grew out E. coli that was resistant to ampicillin, Unasyn, Cipro , Levaquin, tetracyclines, and Bactrim .  It  was susceptible to cephalexin .  I will order a urine dip to assess for the presence of UTI.  Urine dip showed greater than 1000 glucose, moderate RBCs, 100 protein, nitrite positive with trace leukocyte esterase.  I will send urine for culture.  The patient  is on Farxiga  which explains the high volume of glucose in her urine, it also explains why she is getting frequent UTIs.  I will discharge her home on Macrobid  100 mg twice daily for 7 days for treatment of her UTI along with Pyridium  200 mg every 8 hours help with urinary discomfort pending results of the urine culture.     Final Clinical Impressions(s) / UC Diagnoses   Final diagnoses:  UTI symptoms  Lower urinary tract infectious disease     Discharge Instructions      Take the Macrobid  twice daily for 7 days with food for treatment of urinary tract infection.  Use the Pyridium  every 8 hours as needed for urinary discomfort.  This will turn your urine a bright red-orange.  Increase your oral fluid intake so that you increase your urine production and or flushing your urinary system.  Take an over-the-counter probiotic, such as Culturelle-Align-Activia, 1 hour after each dose of antibiotic to prevent diarrhea or yeast infections from forming.  We will culture urine and change the antibiotics if necessary.  Return for reevaluation, or see your primary care provider, for any new or worsening symptoms.      ED Prescriptions     Medication Sig Dispense Auth. Provider   nitrofurantoin , macrocrystal-monohydrate, (MACROBID ) 100 MG capsule Take 1 capsule (100 mg total) by mouth 2 (two) times daily for 7 days. 14 capsule Bernardino Ditch, NP   phenazopyridine  (PYRIDIUM ) 200 MG tablet Take 1 tablet (200 mg total) by mouth 3 (three) times daily. 6 tablet Bernardino Ditch, NP      PDMP not reviewed this encounter.     [1]  Social History Tobacco Use   Smoking status: Former    Current packs/day: 0.00    Types: Cigarettes    Quit date: 08/19/2013    Years since quitting: 11.0   Smokeless tobacco: Never  Vaping Use   Vaping status: Never Used  Substance Use Topics   Alcohol use: No   Drug use: No     Bernardino Ditch, NP 08/25/24 1655  "

## 2024-08-25 NOTE — ED Triage Notes (Signed)
 Pt c/o pain w/urination & foul odor x1 day. Was seen at [facility] ED on 1/6 for the same issue. Given keflex  w/minor relief. Denies hematuria.

## 2024-08-25 NOTE — Discharge Instructions (Addendum)
Take the Macrobid twice daily for 7 days with food for treatment of urinary tract infection.  Use the Pyridium every 8 hours as needed for urinary discomfort.  This will turn your urine a bright red-orange.  Increase your oral fluid intake so that you increase your urine production and or flushing your urinary system.  Take an over-the-counter probiotic, such as Culturelle-Align-Activia, 1 hour after each dose of antibiotic to prevent diarrhea or yeast infections from forming.  We will culture urine and change the antibiotics if necessary.  Return for reevaluation, or see your primary care provider, for any new or worsening symptoms.

## 2024-08-27 ENCOUNTER — Ambulatory Visit (HOSPITAL_COMMUNITY): Payer: Self-pay

## 2024-08-27 LAB — URINE CULTURE
Culture: >=100000 — AB
Special Requests: NORMAL
